# Patient Record
Sex: Male | Born: 1955 | Race: White | Hispanic: No | Marital: Married | Smoking: Never smoker
Health system: Southern US, Community
[De-identification: ages and names within clinical notes are randomized; demographics above are authoritative.]

## PROBLEM LIST (undated history)

## (undated) DIAGNOSIS — E785 Hyperlipidemia, unspecified: Secondary | ICD-10-CM

## (undated) DIAGNOSIS — F419 Anxiety disorder, unspecified: Secondary | ICD-10-CM

## (undated) DIAGNOSIS — K219 Gastro-esophageal reflux disease without esophagitis: Secondary | ICD-10-CM

## (undated) DIAGNOSIS — M542 Cervicalgia: Secondary | ICD-10-CM

## (undated) DIAGNOSIS — F32A Depression, unspecified: Secondary | ICD-10-CM

## (undated) DIAGNOSIS — I10 Essential (primary) hypertension: Secondary | ICD-10-CM

## (undated) DIAGNOSIS — F329 Major depressive disorder, single episode, unspecified: Secondary | ICD-10-CM

## (undated) DIAGNOSIS — G8929 Other chronic pain: Secondary | ICD-10-CM

## (undated) DIAGNOSIS — N400 Enlarged prostate without lower urinary tract symptoms: Secondary | ICD-10-CM

## (undated) HISTORY — DX: Depression, unspecified: F32.A

## (undated) HISTORY — DX: Anxiety disorder, unspecified: F41.9

## (undated) HISTORY — PX: TONSILLECTOMY: SUR1361

## (undated) HISTORY — PX: NECK SURGERY: SHX720

## (undated) HISTORY — PX: CERVICAL DISCECTOMY: SHX98

## (undated) HISTORY — PX: ROTATOR CUFF REPAIR: SHX139

## (undated) HISTORY — PX: CERVICAL FUSION: SHX112

## (undated) HISTORY — DX: Benign prostatic hyperplasia without lower urinary tract symptoms: N40.0

## (undated) HISTORY — DX: Hyperlipidemia, unspecified: E78.5

## (undated) HISTORY — DX: Major depressive disorder, single episode, unspecified: F32.9

---

## 2010-08-19 ENCOUNTER — Other Ambulatory Visit: Payer: Self-pay | Admitting: Neurosurgery

## 2010-08-19 DIAGNOSIS — M542 Cervicalgia: Secondary | ICD-10-CM

## 2010-08-19 DIAGNOSIS — M25519 Pain in unspecified shoulder: Secondary | ICD-10-CM

## 2010-08-24 ENCOUNTER — Ambulatory Visit
Admission: RE | Admit: 2010-08-24 | Discharge: 2010-08-24 | Disposition: A | Discharge status: Home / Self Care | Payer: BC Managed Care – PPO | Source: Ambulatory Visit | Attending: Neurosurgery | Admitting: Neurosurgery

## 2010-08-24 DIAGNOSIS — M542 Cervicalgia: Secondary | ICD-10-CM

## 2010-08-24 DIAGNOSIS — M25519 Pain in unspecified shoulder: Secondary | ICD-10-CM

## 2010-08-24 MED ORDER — IOHEXOL 300 MG/ML  SOLN: 75.0000 mL | Freq: Once | INTRAMUSCULAR | Status: AC | PRN

## 2010-08-27 ENCOUNTER — Other Ambulatory Visit: Payer: Self-pay | Admitting: Neurosurgery

## 2010-08-27 ENCOUNTER — Ambulatory Visit
Admission: RE | Admit: 2010-08-27 | Discharge: 2010-08-27 | Disposition: A | Discharge status: Home / Self Care | Payer: BC Managed Care – PPO | Source: Ambulatory Visit | Attending: Neurosurgery | Admitting: Neurosurgery

## 2010-08-27 DIAGNOSIS — G971 Other reaction to spinal and lumbar puncture: Secondary | ICD-10-CM

## 2010-10-11 ENCOUNTER — Other Ambulatory Visit (HOSPITAL_COMMUNITY): Payer: Self-pay | Admitting: Neurosurgery

## 2010-10-11 ENCOUNTER — Encounter (HOSPITAL_COMMUNITY)
Admission: RE | Admit: 2010-10-11 | Discharge: 2010-10-11 | Disposition: A | Discharge status: Home / Self Care | Payer: BC Managed Care – PPO | Source: Ambulatory Visit | Attending: Neurosurgery | Admitting: Neurosurgery

## 2010-10-11 ENCOUNTER — Ambulatory Visit (HOSPITAL_COMMUNITY)
Admission: RE | Admit: 2010-10-11 | Discharge: 2010-10-11 | Disposition: A | Discharge status: Home / Self Care | Payer: BC Managed Care – PPO | Source: Ambulatory Visit | Attending: Neurosurgery | Admitting: Neurosurgery

## 2010-10-11 DIAGNOSIS — Z01818 Encounter for other preprocedural examination: Secondary | ICD-10-CM | POA: Insufficient documentation

## 2010-10-11 DIAGNOSIS — M5020 Other cervical disc displacement, unspecified cervical region: Secondary | ICD-10-CM

## 2010-10-11 DIAGNOSIS — Z01812 Encounter for preprocedural laboratory examination: Secondary | ICD-10-CM | POA: Insufficient documentation

## 2010-10-11 LAB — COMPREHENSIVE METABOLIC PANEL
BUN: 17 mg/dL (ref 6–23)
CO2: 30 mEq/L (ref 19–32)
Chloride: 104 mEq/L (ref 96–112)
Creatinine, Ser: 0.83 mg/dL (ref 0.4–1.5)
GFR calc non Af Amer: >60 mL/min (ref >60)
Glucose, Bld: 88 mg/dL (ref 70–99)
Total Bilirubin: 0.6 mg/dL (ref 0.3–1.2)

## 2010-10-11 LAB — TYPE AND SCREEN
ABO/RH(D): A POS
Antibody Screen: NEGATIVE

## 2010-10-11 LAB — CBC
HCT: 40.9 % (ref 39.0–52.0)
Hemoglobin: 14 g/dL (ref 13.0–17.0)
MCH: 30.8 pg (ref 26.0–34.0)
MCV: 90.1 fL (ref 78.0–100.0)
Platelets: 221 10*3/uL (ref 150–400)
RBC: 4.54 MIL/uL (ref 4.22–5.81)

## 2010-10-11 LAB — DIFFERENTIAL
Eosinophils Absolute: 0.2 10*3/uL (ref 0.0–0.7)
Lymphs Abs: 1.5 10*3/uL (ref 0.7–4.0)
Monocytes Relative: 11 % (ref 3–12)
Neutrophils Relative %: 57 % (ref 43–77)

## 2010-10-18 ENCOUNTER — Inpatient Hospital Stay (HOSPITAL_COMMUNITY): Payer: BC Managed Care – PPO

## 2010-10-18 ENCOUNTER — Observation Stay (HOSPITAL_COMMUNITY)
Admission: RE | Admit: 2010-10-18 | Discharge: 2010-10-19 | DRG: 865 | Disposition: A | Discharge status: Home / Self Care | Payer: BC Managed Care – PPO | Source: Ambulatory Visit | Attending: Neurosurgery | Admitting: Neurosurgery

## 2010-10-18 DIAGNOSIS — Z01810 Encounter for preprocedural cardiovascular examination: Secondary | ICD-10-CM | POA: Insufficient documentation

## 2010-10-18 DIAGNOSIS — M47812 Spondylosis without myelopathy or radiculopathy, cervical region: Principal | ICD-10-CM | POA: Insufficient documentation

## 2010-10-18 DIAGNOSIS — K449 Diaphragmatic hernia without obstruction or gangrene: Secondary | ICD-10-CM | POA: Insufficient documentation

## 2010-10-18 DIAGNOSIS — G4733 Obstructive sleep apnea (adult) (pediatric): Secondary | ICD-10-CM | POA: Insufficient documentation

## 2010-10-18 DIAGNOSIS — I10 Essential (primary) hypertension: Secondary | ICD-10-CM | POA: Insufficient documentation

## 2010-10-28 ENCOUNTER — Ambulatory Visit
Admission: RE | Admit: 2010-10-28 | Discharge: 2010-10-28 | Disposition: A | Discharge status: Home / Self Care | Payer: BC Managed Care – PPO | Source: Ambulatory Visit | Attending: Neurosurgery | Admitting: Neurosurgery

## 2010-10-28 ENCOUNTER — Other Ambulatory Visit: Payer: Self-pay | Admitting: Neurosurgery

## 2010-10-28 DIAGNOSIS — M542 Cervicalgia: Secondary | ICD-10-CM

## 2010-10-28 NOTE — Op Note (Signed)
Mark Maxwell, Mark Maxwell              ACCOUNT NO.:  [id number]  MEDICAL RECORD NO.:  [id number]           PATIENT TYPE:  I  LOCATION:  3524                         FACILITY:  [facility]  PHYSICIAN:  Asahd Can A. Lian Tanori, M.D.    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  PROCEDURE NAME:  C4-5, C5-6, C6-7 anterior cervical diskectomy and fusion with allograft and plating.  SURGEON:  Kathaleen Maser. Shalah Estelle, MD  ASSISTANT:  Donalee Citrin, MD  ANESTHESIA:  General endotracheal.  INDICATIONS:  Mark Maxwell is a 55 year old male with history of chronic neck pain with radiation to his right upper extremity.  The patient has undergone MRI scanning and CT myelogram, which demonstrates evidence of marked spondylosis and stenosis at C4-5, C5-6 and C6-7.  The patient counseled as to his options.  He decided to proceed with three-level anterior cervical diskectomy and fusion with allograft and plating in hopes of improving his symptoms.  OPERATION:  The patient was taken to the operating room and placed on the table in supine position.  After adequate level of anesthesia was achieved, the patient was positioned supine with neck slightly extended and held in place with halter traction.  The patient's anterior cervical region was prepped and draped sterilely.  A #10 blade was used to make a linear skin incision overlying the C5-6 level.  This was carried down sharply to the platysma.  The platysma was then divided vertically and dissection proceeded on the medial border of the sternocleidomastoid muscle and carotid sheath.  Trachea and esophagus were mobilized and retracted towards the left.  The prevertebral fascia was stripped off the anterior spinal column.   Longus colli muscle was elevated bilaterally using electrocautery.  Deep self-retaining retractor was placed.  Intraoperative fluoroscopy was used and the levels were confirmed.  Disk spaces at C4-5, C5-6 and C6-7 were then incised with a 15-blade in a rectangular fashion.  Wide disk space clean-out was achieved using pituitary rongeurs, forward and backward Karlin curettes, Kerrison rongeurs, and high-speed drill.  Elements of the disk removed down to the level of the posterior annulus.  Microscope was then brought into the field and used throughout the remainder of diskectomy.  The remaining aspects of annulus and osteophytes were removed using high- speed drill down to the level of the posterior longitudinal ligament. The posterior longitudinal ligament was elevated and resected in piecemeal fashion using Kerrison rongeurs.  The underlying thecal sac was then identified.  Wide central decompression then was performed by undercutting the bodies of C6 and C7.  Decompression was then proceeded out to the edges of the neural foramen.  Wide anterior foraminotomies were then performed along the course of the exiting C7 nerve roots bilaterally.  At this point, a very thorough decompression had been achieved.  There was no evidence of injury to the thecal sac or nerve roots.  Procedure then repeated again at C5-6 and C4-5.  Wound was then irrigated with antibiotic solution.  Gelfoam was placed topically for hemostasis, and found to be good.  Cornerstone allograft wedges were then packed into place at C4-5, C5-6, and C6-7.  There were recessed approximately 1 mm from the anterior cortical margin at all three levels.  A 63-mm Atlantis translational anterior plate was then placed over the C4, C5, C6, and C7 levels.  This was then attached under fluoroscopic guidance using 13-mm fixed angle screws, two each at all four levels.  All screws given final tightening and found to be solidly within the  bone.  Locking screws were engaged at all levels.  Final images revealed good position of the bone grafts, hardware at proper operative level with normal alignment of spine.  The wound was then irrigated with antibiotic solution.  Hemostasis was then ensured with bipolar electrocautery.  A medium Hemovac drain was left in the prevertebral space.  Wound was then closed in typical fashion.  Steri- Strips and sterile dressings were applied.  There were no complications. The patient tolerated procedure well and he returns to recovery room postoperatively.          ______________________________ Kathaleen Maser Mark Maxwell, M.D.     HAP/MEDQ  D:  10/18/2010  T:  10/18/2010  Job:  [id number]  Electronically Signed by Julio Sicks M.D. on 10/28/2010 11:19:17 AM

## 2010-12-09 ENCOUNTER — Ambulatory Visit
Admission: RE | Admit: 2010-12-09 | Discharge: 2010-12-09 | Disposition: A | Discharge status: Home / Self Care | Payer: BC Managed Care – PPO | Source: Ambulatory Visit | Attending: Neurosurgery | Admitting: Neurosurgery

## 2010-12-09 ENCOUNTER — Other Ambulatory Visit: Payer: Self-pay | Admitting: Neurosurgery

## 2010-12-09 DIAGNOSIS — M542 Cervicalgia: Secondary | ICD-10-CM

## 2011-05-02 IMAGING — CR DG CHEST 2V
2 series · 2 of 2 positions shown · non-contrast
Comparison: None.

CLINICAL DATA: Pre admit for herniated cervical disc

CHEST - 2 VIEW

[view not recorded (1 of 2)]
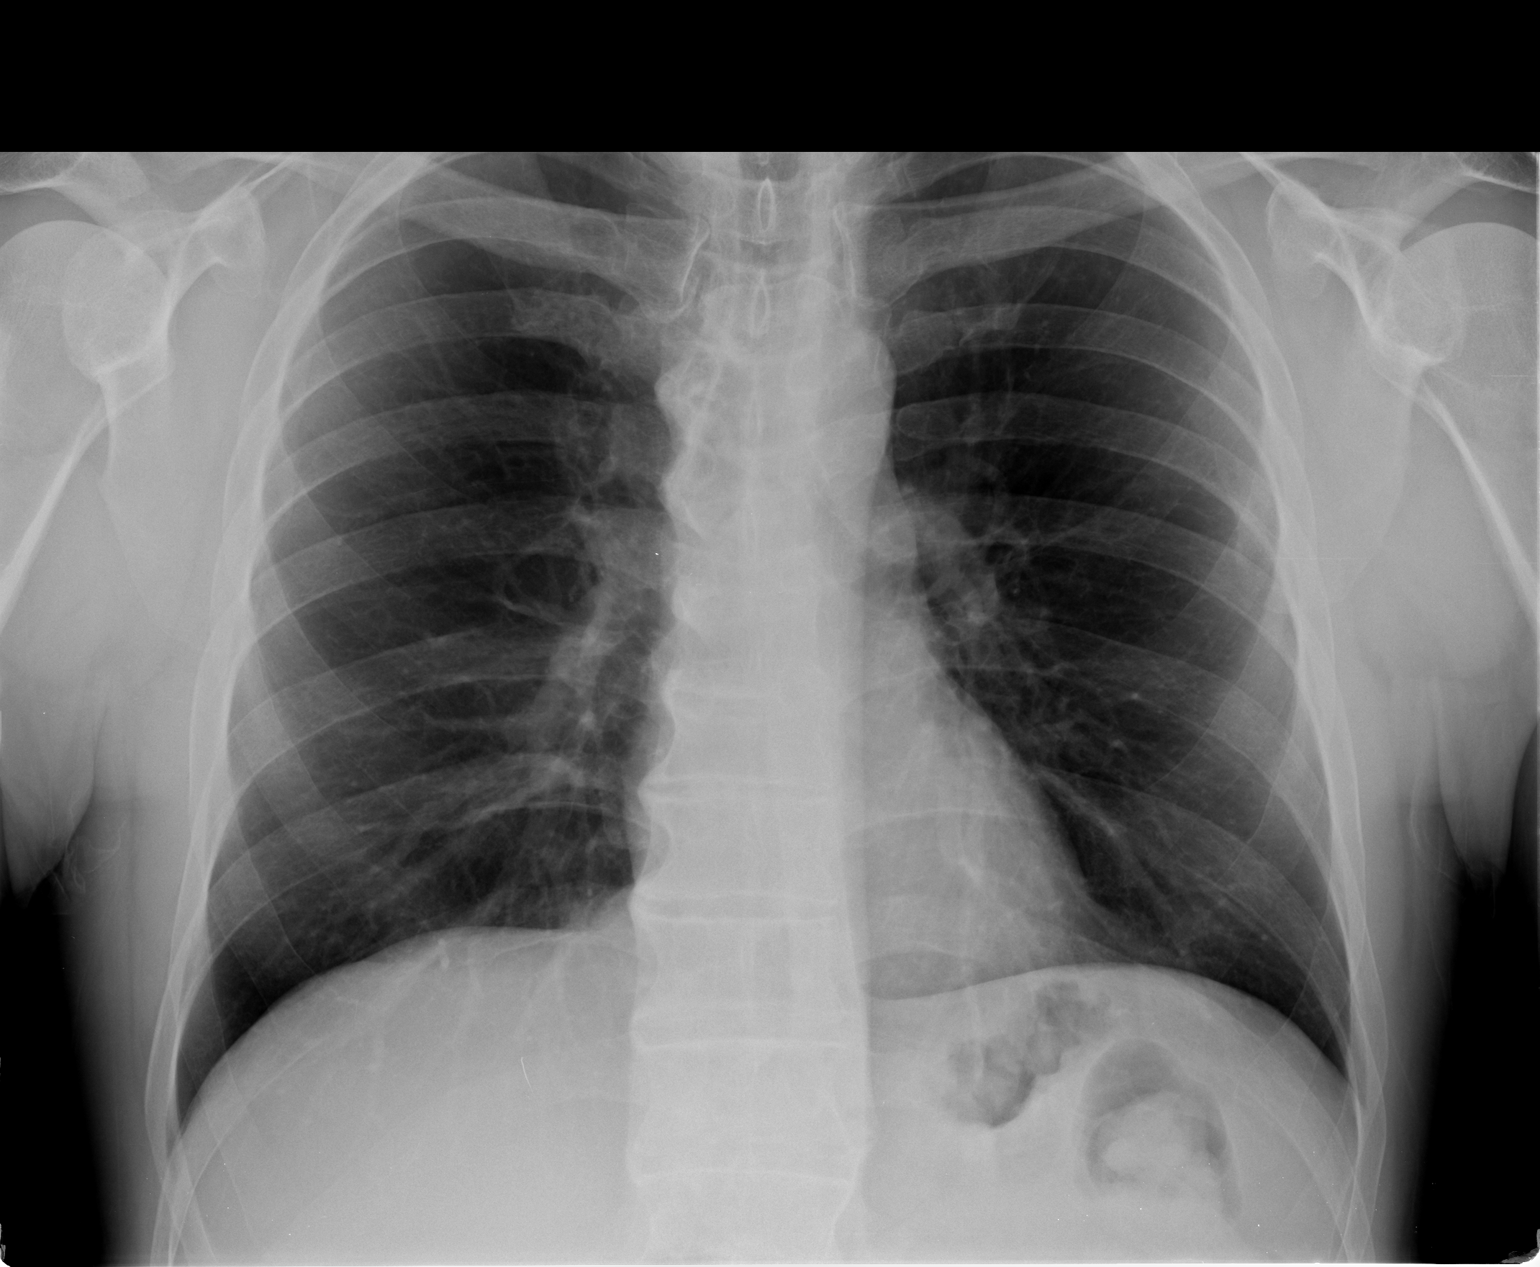

[view not recorded (2 of 2)]
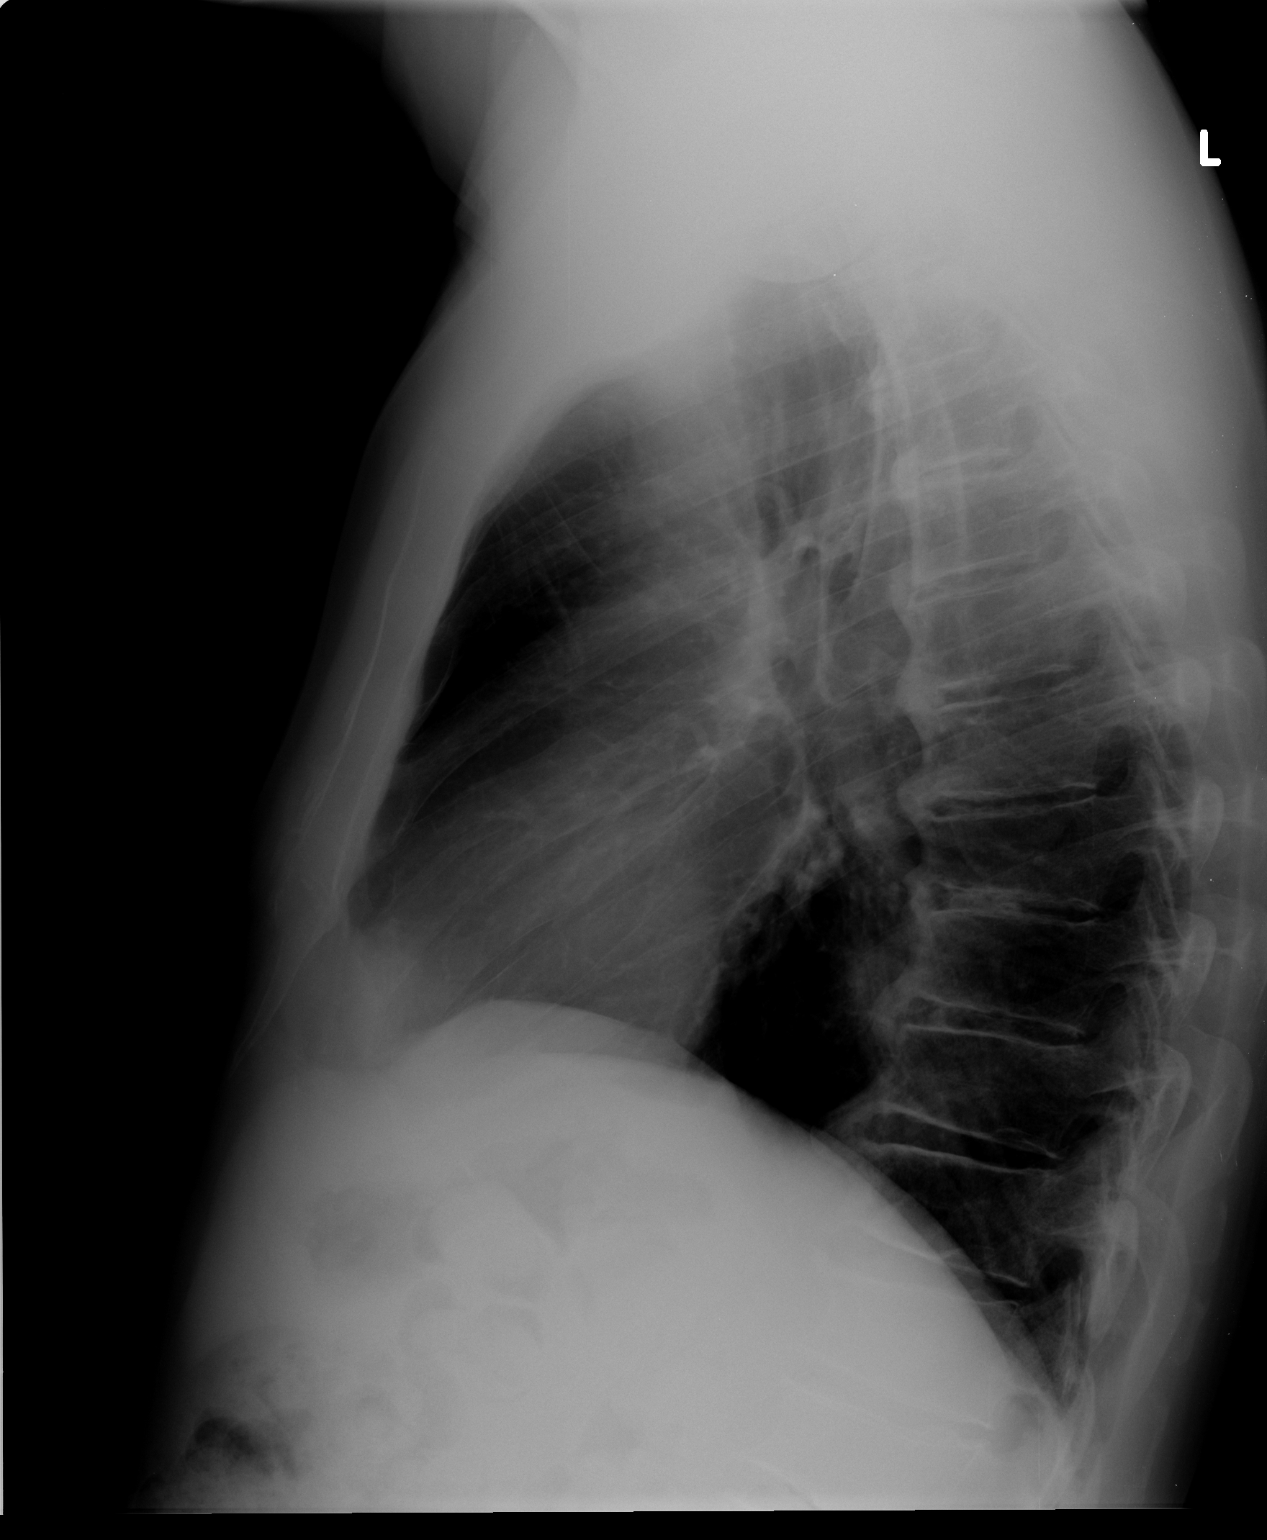

[2 of 2 positions shown; findings below may reference images not displayed]

FINDINGS: Heart and mediastinal contours normal.  Lungs clear.
Moderately advanced degenerative changes of the dorsal spine noted.
IMPRESSION: No active cardiopulmonary disease.

## 2011-05-12 ENCOUNTER — Other Ambulatory Visit: Payer: Self-pay | Admitting: Neurosurgery

## 2011-05-12 ENCOUNTER — Ambulatory Visit
Admission: RE | Admit: 2011-05-12 | Discharge: 2011-05-12 | Disposition: A | Discharge status: Home / Self Care | Payer: BC Managed Care – PPO | Source: Ambulatory Visit | Attending: Neurosurgery | Admitting: Neurosurgery

## 2011-05-12 DIAGNOSIS — M542 Cervicalgia: Secondary | ICD-10-CM

## 2011-06-30 IMAGING — CR DG CERVICAL SPINE 2 OR 3 VIEWS
3 series · 3 of 3 positions shown · non-contrast
Comparison: 10/28/2010

CLINICAL DATA: Right neck pain/post cervical fusion

CERVICAL SPINE - 2-3 VIEW

[w c-spine lat]
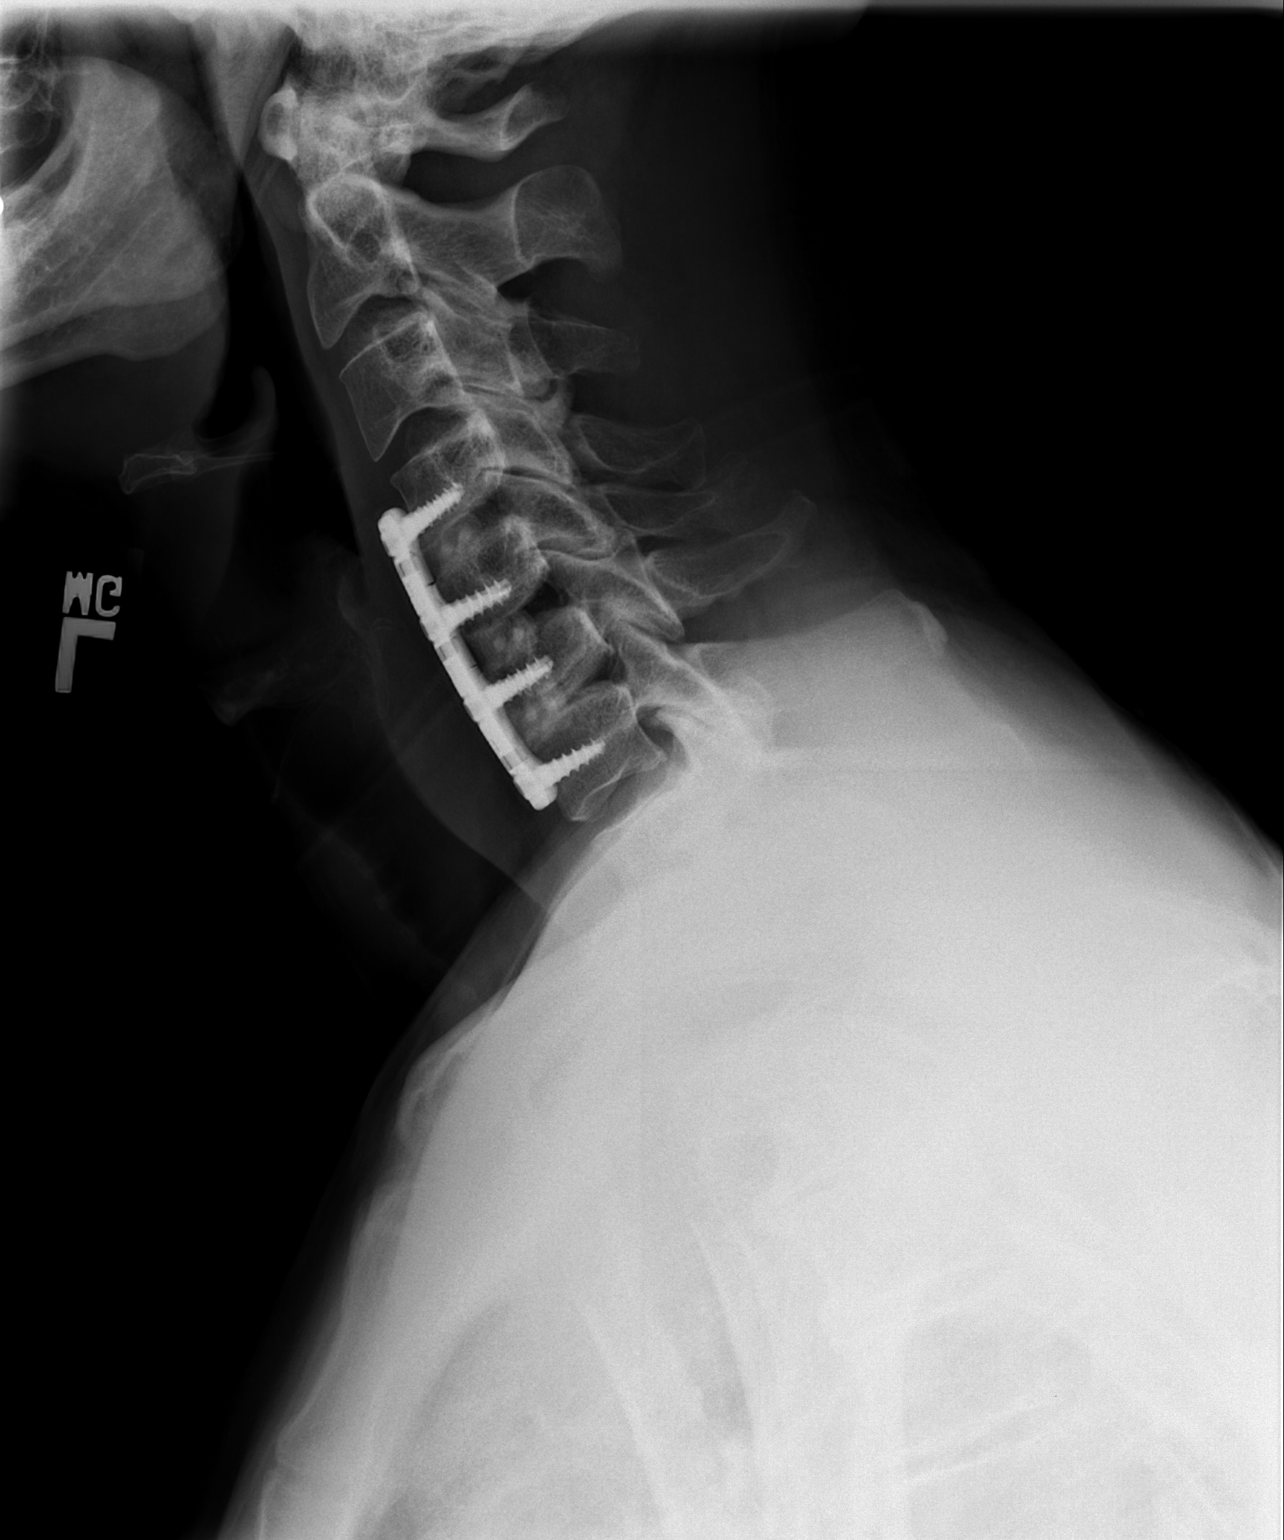

[w c-spine flexion]
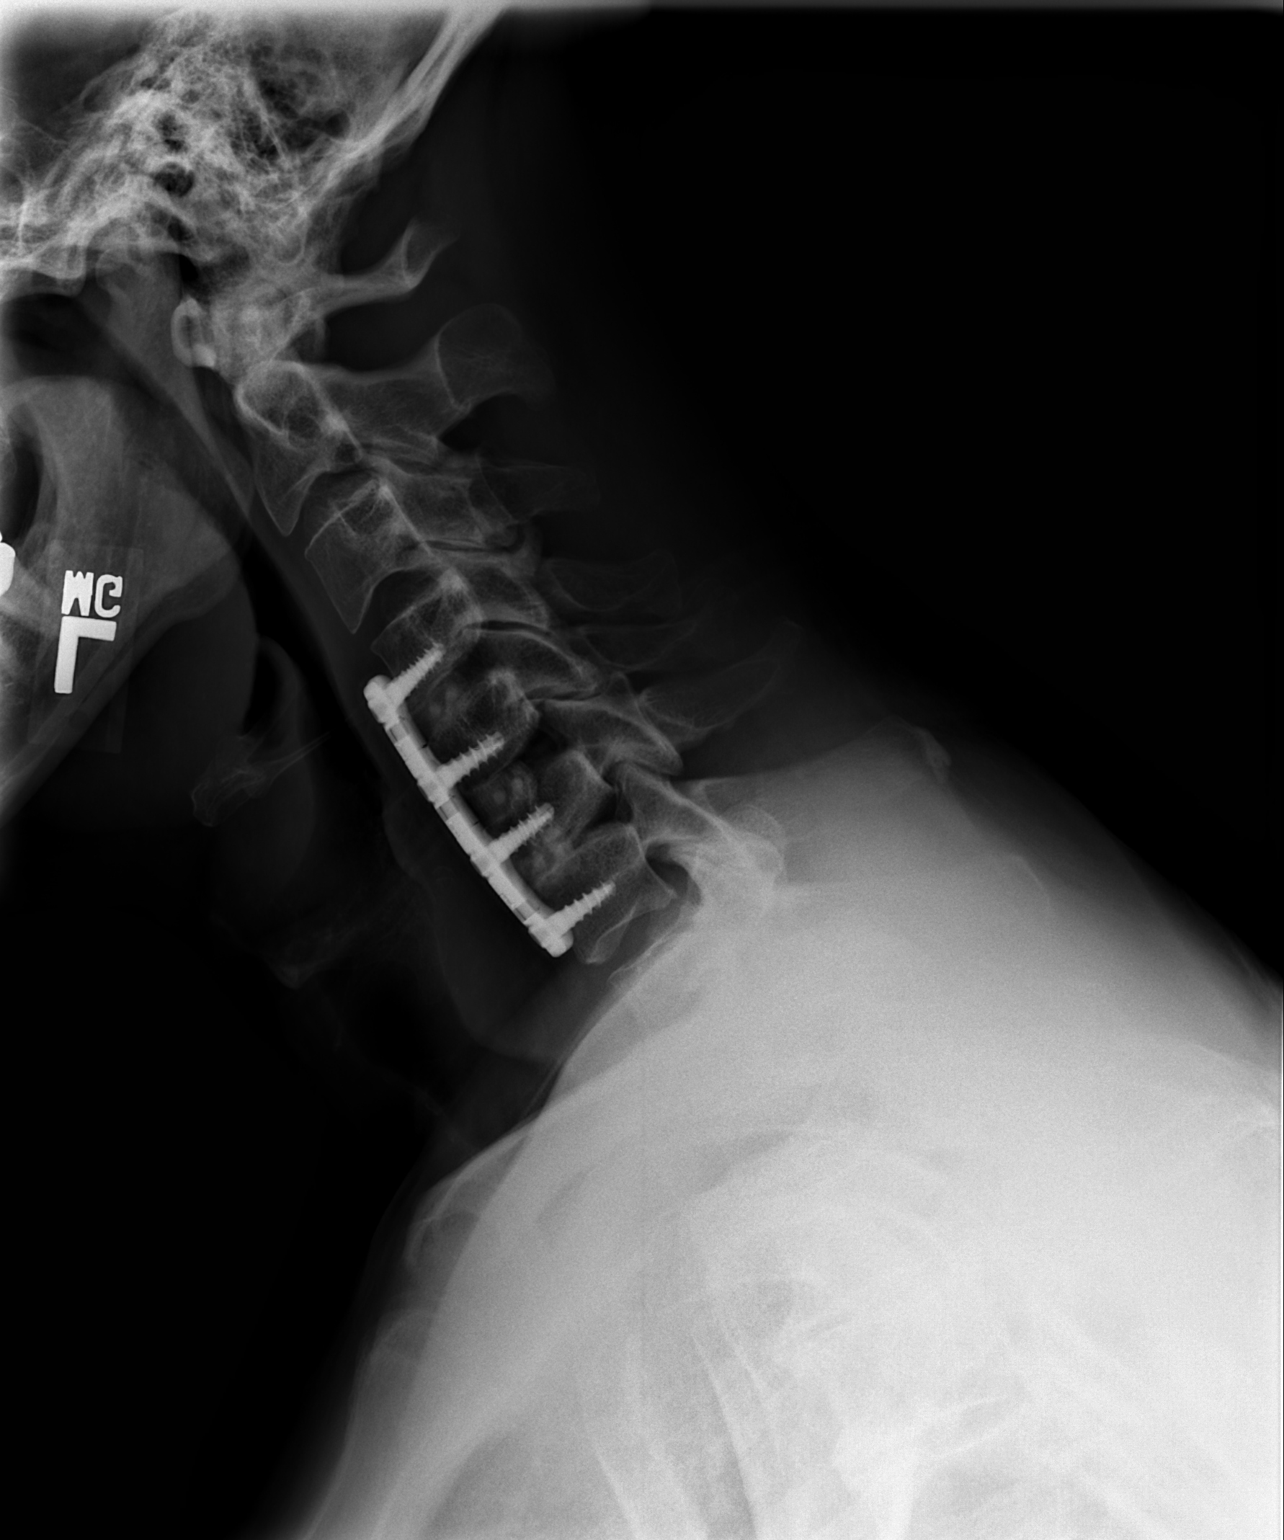

[w c-spine extension *]
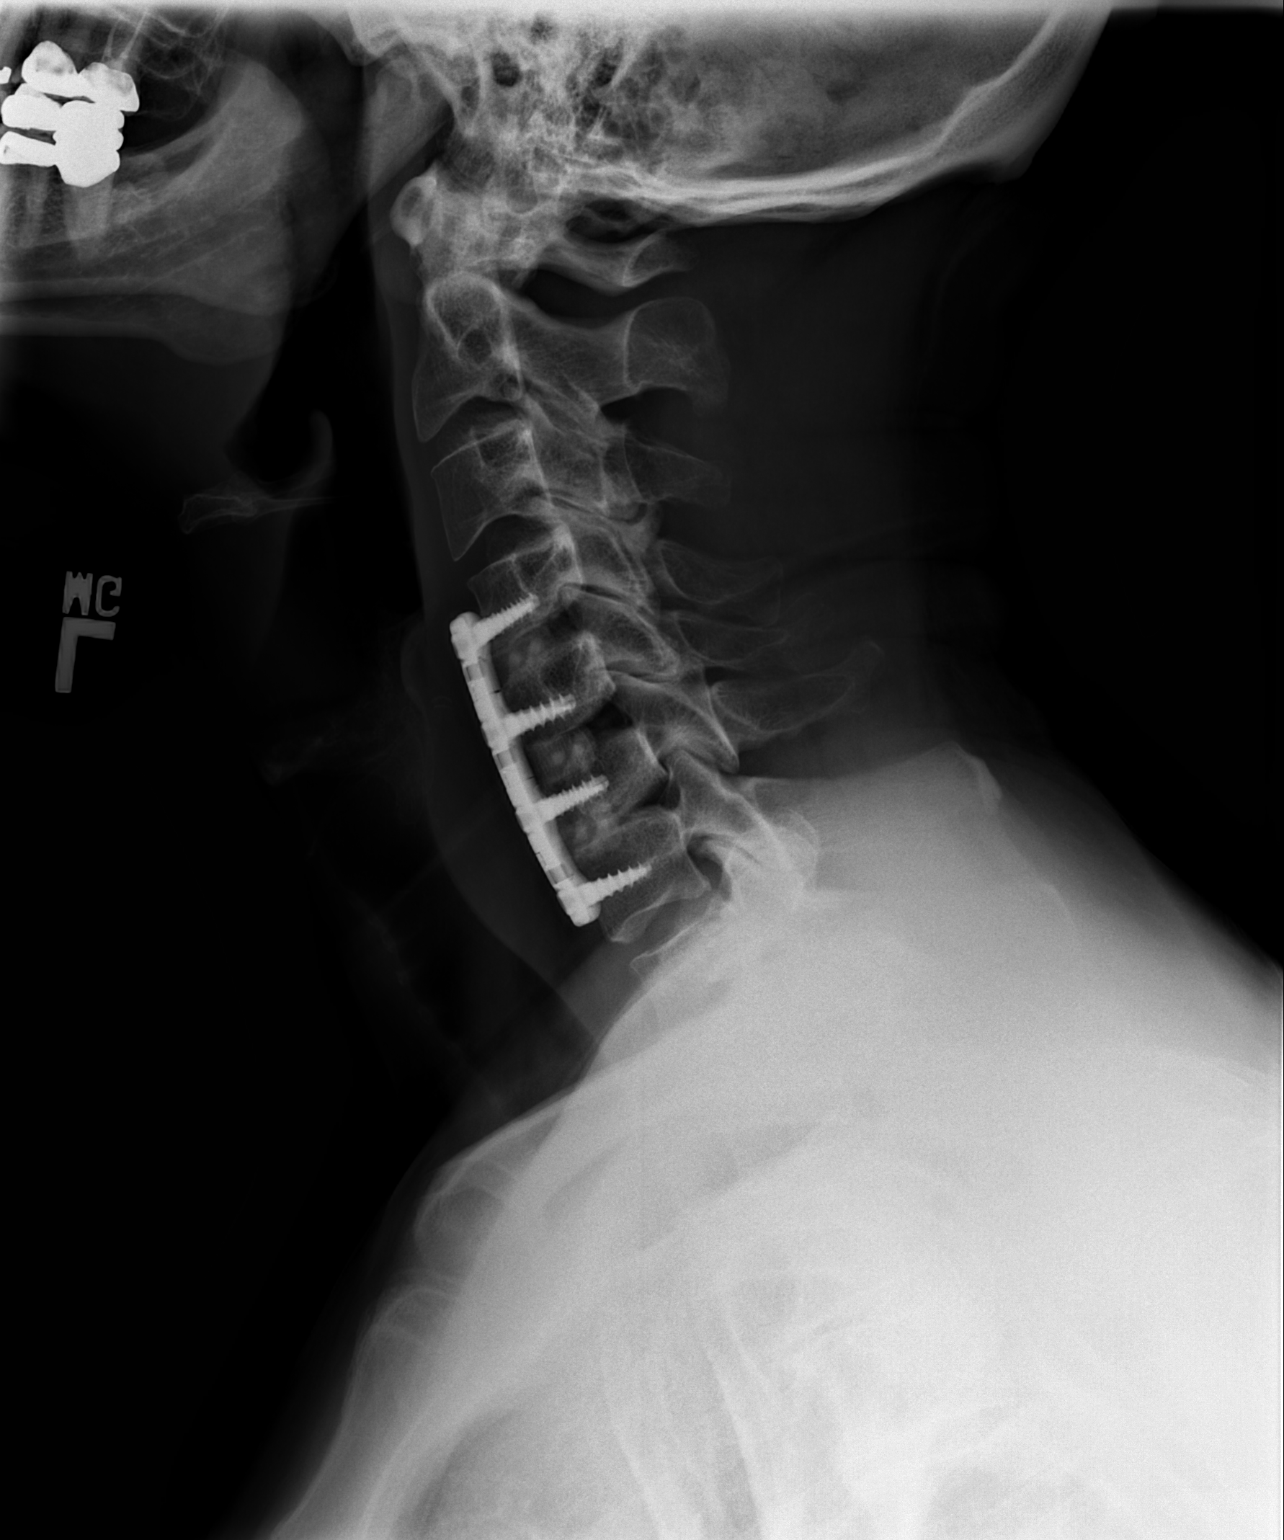

[3 of 3 positions shown; findings below may reference images not displayed]

FINDINGS: Post C4-C7 ACDF with anterior plate.  Good position and
alignment of bony elements and hardware.  No obvious complications
or interval change.

Range of motion is limited.  No obvious instability through flexion
or extension.
IMPRESSION: Good position and alignment following C4-C7 ACDF with anterior
plate.

## 2012-01-16 ENCOUNTER — Other Ambulatory Visit: Payer: Self-pay | Admitting: Neurosurgery

## 2012-01-16 DIAGNOSIS — M549 Dorsalgia, unspecified: Secondary | ICD-10-CM

## 2012-01-16 DIAGNOSIS — M542 Cervicalgia: Secondary | ICD-10-CM

## 2012-01-27 ENCOUNTER — Ambulatory Visit
Admission: RE | Admit: 2012-01-27 | Discharge: 2012-01-27 | Disposition: A | Discharge status: Home / Self Care | Payer: BC Managed Care – PPO | Source: Ambulatory Visit | Attending: Neurosurgery | Admitting: Neurosurgery

## 2012-01-27 VITALS — BP 130/67 | HR 58

## 2012-01-27 DIAGNOSIS — M542 Cervicalgia: Secondary | ICD-10-CM

## 2012-01-27 DIAGNOSIS — M549 Dorsalgia, unspecified: Secondary | ICD-10-CM

## 2012-01-27 MED ORDER — MEPERIDINE HCL 100 MG/ML IJ SOLN
75.0000 mg | Freq: Once | INTRAMUSCULAR | Status: AC
  Administered 2012-01-27: 75 mg via INTRAMUSCULAR

## 2012-01-27 MED ORDER — IOHEXOL 300 MG/ML  SOLN
10.0000 mL | Freq: Once | INTRAMUSCULAR | Status: AC | PRN
  Administered 2012-01-27: 10 mL via INTRATHECAL

## 2012-01-27 MED ORDER — ONDANSETRON HCL 4 MG/2ML IJ SOLN
4.0000 mg | Freq: Once | INTRAMUSCULAR | Status: AC
  Administered 2012-01-27: 4 mg via INTRAMUSCULAR

## 2012-01-27 MED ORDER — DIAZEPAM 5 MG PO TABS
10.0000 mg | ORAL_TABLET | Freq: Once | ORAL | Status: AC
  Administered 2012-01-27: 10 mg via ORAL

## 2012-01-29 ENCOUNTER — Emergency Department (HOSPITAL_BASED_OUTPATIENT_CLINIC_OR_DEPARTMENT_OTHER): Payer: No Typology Code available for payment source

## 2012-01-29 ENCOUNTER — Emergency Department (HOSPITAL_BASED_OUTPATIENT_CLINIC_OR_DEPARTMENT_OTHER)
Admission: EM | Admit: 2012-01-29 | Discharge: 2012-01-29 | Disposition: A | Discharge status: Home / Self Care | Payer: No Typology Code available for payment source | Attending: Emergency Medicine | Admitting: Emergency Medicine

## 2012-01-29 ENCOUNTER — Encounter (HOSPITAL_BASED_OUTPATIENT_CLINIC_OR_DEPARTMENT_OTHER): Payer: Self-pay | Admitting: Emergency Medicine

## 2012-01-29 DIAGNOSIS — Z981 Arthrodesis status: Secondary | ICD-10-CM | POA: Insufficient documentation

## 2012-01-29 DIAGNOSIS — S161XXA Strain of muscle, fascia and tendon at neck level, initial encounter: Secondary | ICD-10-CM

## 2012-01-29 DIAGNOSIS — M549 Dorsalgia, unspecified: Secondary | ICD-10-CM | POA: Insufficient documentation

## 2012-01-29 DIAGNOSIS — I10 Essential (primary) hypertension: Secondary | ICD-10-CM | POA: Insufficient documentation

## 2012-01-29 DIAGNOSIS — M542 Cervicalgia: Secondary | ICD-10-CM | POA: Insufficient documentation

## 2012-01-29 DIAGNOSIS — S139XXA Sprain of joints and ligaments of unspecified parts of neck, initial encounter: Secondary | ICD-10-CM | POA: Insufficient documentation

## 2012-01-29 HISTORY — DX: Other chronic pain: G89.29

## 2012-01-29 HISTORY — DX: Cervicalgia: M54.2

## 2012-01-29 HISTORY — DX: Gastro-esophageal reflux disease without esophagitis: K21.9

## 2012-01-29 HISTORY — DX: Essential (primary) hypertension: I10

## 2012-01-29 MED ORDER — HYDROMORPHONE HCL PF 2 MG/ML IJ SOLN
2.0000 mg | Freq: Once | INTRAMUSCULAR | Status: AC
  Administered 2012-01-29: 2 mg via INTRAMUSCULAR
  Filled 2012-01-29: qty 1
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
## 2012-01-29 NOTE — ED Notes (Signed)
Returned from xray

## 2012-05-02 ENCOUNTER — Encounter (HOSPITAL_COMMUNITY): Payer: Self-pay | Admitting: Psychiatry

## 2012-05-02 ENCOUNTER — Ambulatory Visit (INDEPENDENT_AMBULATORY_CARE_PROVIDER_SITE_OTHER): Payer: BC Managed Care – PPO | Admitting: Psychiatry

## 2012-05-02 VITALS — BP 118/68 | HR 66 | Ht 73.0 in | Wt 188.8 lb

## 2012-05-02 DIAGNOSIS — F329 Major depressive disorder, single episode, unspecified: Secondary | ICD-10-CM | POA: Insufficient documentation

## 2012-05-02 DIAGNOSIS — F0630 Mood disorder due to known physiological condition, unspecified: Secondary | ICD-10-CM

## 2012-05-02 MED ORDER — AMITRIPTYLINE HCL 25 MG PO TABS: 25.0000 mg | ORAL_TABLET | Freq: Every day | ORAL | Status: DC
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
## 2012-05-24 ENCOUNTER — Encounter (HOSPITAL_COMMUNITY): Payer: Self-pay | Admitting: Psychiatry

## 2012-05-24 ENCOUNTER — Ambulatory Visit (INDEPENDENT_AMBULATORY_CARE_PROVIDER_SITE_OTHER): Payer: BC Managed Care – PPO | Admitting: Psychiatry

## 2012-05-24 VITALS — BP 122/65 | HR 75 | Wt 190.8 lb

## 2012-05-24 DIAGNOSIS — F0630 Mood disorder due to known physiological condition, unspecified: Secondary | ICD-10-CM

## 2012-05-24 MED ORDER — AMITRIPTYLINE HCL 25 MG PO TABS: ORAL_TABLET | ORAL | Status: DC
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
## 2012-05-25 ENCOUNTER — Encounter (HOSPITAL_COMMUNITY): Payer: Self-pay | Admitting: Licensed Clinical Social Worker

## 2012-05-25 ENCOUNTER — Ambulatory Visit (INDEPENDENT_AMBULATORY_CARE_PROVIDER_SITE_OTHER): Payer: BC Managed Care – PPO | Admitting: Licensed Clinical Social Worker

## 2012-05-25 DIAGNOSIS — F329 Major depressive disorder, single episode, unspecified: Secondary | ICD-10-CM
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
## 2012-06-07 ENCOUNTER — Encounter (HOSPITAL_COMMUNITY): Payer: Self-pay | Admitting: Licensed Clinical Social Worker

## 2012-06-07 ENCOUNTER — Ambulatory Visit (INDEPENDENT_AMBULATORY_CARE_PROVIDER_SITE_OTHER): Payer: BC Managed Care – PPO | Admitting: Licensed Clinical Social Worker

## 2012-06-07 DIAGNOSIS — F329 Major depressive disorder, single episode, unspecified: Secondary | ICD-10-CM

## 2012-06-07 NOTE — Progress Notes (Signed)
   THERAPIST PROGRESS NOTE  Session Time: 9:30am-10:20am  Participation Level: Active  Behavioral Response: Well GroomedAlertDepressed  Type of Therapy: Individual Therapy  Treatment Goals addressed: Coping  Interventions: CBT, Motivational Interviewing, Solution Focused, Strength-based, Supportive and Reframing  Summary: Mark Maxwell is a 56 y.o. male who presents with depressed mood and flat affect. He reports ongoing depression, frustration and anxiety related to his job and passive suicidal ideation. The thoughts are disturbing to him but he clearly states that he will not act upon them because of his faith and his family. His primary stressor is his job. He feels taken advantage of and that the expectations under which he must work are unreasonable. He tries to do everything which is asked of him, but is unable to do so. He did talk to his boss and request a change of his job so he would no longer be in charge, but he was told no. He finds it hard to set limits and say no and realizes that he identifies himself as the man who takes care of everyone and everything. His pain level is very high today and he plans to see a doctor in [facility] soon for another opinion.    Suicidal/Homicidal: Yeswithout intent/plan  Therapist Response: Assessed patients current functioning and reviewed progress. Reviewed coping strategies. Assessed patients safety and assisted in identifying protective factors.  Reviewed crisis plan with patient. Assisted patient with the expression of his feelings of depression and frustration. Discussed healthy boundaries and assertive communication. Assessed areas at work where he can begin to delegate. Patient does have a gun in his home and is willing to lock it up and give his wife the key so he does not have access to it. He is able to clearly communicate his protective factors and that his suicidality is passive. Used CBT to assist patient with the identification of  negative distortions and irrational thoughts. Encouraged patient to verbalize alternative and factual responses which challenge thought distortions. Used motivational interviewing to assist and encourage patient through the change process. Explored patients barriers to change. Reviewed patients self care plan. Assessed  progress related to self care. Patient's self care is good, but could improve. Recommend proper diet, regular exercise, socialization and recreation.   Plan: Return again in one weeks.  Diagnosis: Axis I: Major Depression, Recurrent severe    Axis II: No diagnosis    Tinesha Siegrist, LCSW 06/07/2012

## 2012-06-21 ENCOUNTER — Ambulatory Visit (INDEPENDENT_AMBULATORY_CARE_PROVIDER_SITE_OTHER): Payer: BC Managed Care – PPO | Admitting: Licensed Clinical Social Worker

## 2012-06-21 DIAGNOSIS — F329 Major depressive disorder, single episode, unspecified: Secondary | ICD-10-CM

## 2012-06-21 NOTE — Progress Notes (Signed)
   THERAPIST PROGRESS NOTE  Session Time: 4:00pm-4:50pm  Participation Level: Active  Behavioral Response: Well GroomedAlertDepressed  Type of Therapy: Individual Therapy  Treatment Goals addressed: Coping  Interventions: CBT, Motivational Interviewing, Solution Focused, Strength-based, Supportive and Reframing  Summary: Mark Maxwell is a 57 y.o. male who presents with depressed mood and flat affect. He reports ongoing depression with passive suicidal thoughts and does not feel that his medication is working as well as it could. Recommend patient discuss a medication change or increase with Dr. Lolly Mustache. He is pleased that he talked to his boss and told her that he needs to step down and can no longer be in charge. He feels this is a huge weight off his shoulders. He is fearful that he is going to have to work under his current partner whom he has difficulty with. He has an expectation of himself to be "tough" and feels that he is weak by asking for help. His pain is not well controlled and he is fearful to take more pain medication because he does not want to become addicted to it. He is able to recognize that his core beliefs are interfering with his self care, but is challenged by changing this. His sleep is poor and his appetite is wnl.    Suicidal/Homicidal: Nowithout intent/plan  Therapist Response: Assessed patients current functioning and reviewed progress. Reviewed coping strategies. Assessed patients safety and assisted in identifying protective factors.  Reviewed crisis plan with patient. Assisted patient with the expression of his feelings of depression and frustration. Patient has normalized his overcommittment at work.  Used CBT to assist patient with the identification of negative distortions and irrational thoughts. Encouraged patient to verbalize alternative and factual responses which challenge thought distortions. [name] resistant when his core beliefs about being strong and  tough no matter what are challenged. Reviewed patients self care plan. Assessed  progress related to self care. Patient's self care is fair. Recommend proper diet, regular exercise, socialization and recreation. Used motivational interviewing to assist and encourage patient through the change process. Explored patients barriers to change. His able to commit to safety, agrees to seek help if he becomes fearful that he will harm himself and is able to discuss his protective factors such as his wife and children.   Plan: Return again in one weeks.  Diagnosis: Axis I: Major Depression, Recurrent severe    Axis II: No diagnosis    Mark Ludlam, LCSW 06/21/2012

## 2012-06-26 ENCOUNTER — Ambulatory Visit (HOSPITAL_COMMUNITY): Payer: Self-pay | Admitting: Licensed Clinical Social Worker

## 2012-07-05 ENCOUNTER — Ambulatory Visit (INDEPENDENT_AMBULATORY_CARE_PROVIDER_SITE_OTHER): Payer: BC Managed Care – PPO | Admitting: Psychiatry

## 2012-07-05 ENCOUNTER — Encounter (HOSPITAL_COMMUNITY): Payer: Self-pay | Admitting: Psychiatry

## 2012-07-05 VITALS — BP 124/85 | HR 90 | Wt 198.6 lb

## 2012-07-05 DIAGNOSIS — F0630 Mood disorder due to known physiological condition, unspecified: Secondary | ICD-10-CM

## 2012-07-05 MED ORDER — AMITRIPTYLINE HCL 75 MG PO TABS: 75.0000 mg | ORAL_TABLET | Freq: Every day | ORAL | Status: DC

## 2012-07-05 MED ORDER — AMITRIPTYLINE HCL 25 MG PO TABS: ORAL_TABLET | ORAL | Status: DC
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
## 2012-07-06 ENCOUNTER — Ambulatory Visit (INDEPENDENT_AMBULATORY_CARE_PROVIDER_SITE_OTHER): Payer: BC Managed Care – PPO | Admitting: Licensed Clinical Social Worker

## 2012-07-06 DIAGNOSIS — F329 Major depressive disorder, single episode, unspecified: Secondary | ICD-10-CM

## 2012-07-06 NOTE — Progress Notes (Signed)
   THERAPIST PROGRESS NOTE  Session Time: 9:30am-10:20am  Participation Level: Active  Behavioral Response: Well GroomedAlertDepressed  Type of Therapy: Individual Therapy  Treatment Goals addressed: Coping  Interventions: CBT, Motivational Interviewing, Strength-based, Supportive and Reframing  Summary: Mark Maxwell is a 57 y.o. male who presents with depressed mood and flat affect. His chronic pain is very high today and he reports continued feelings of depression with passive suicidal ideation. His pain and his job are the primary stressors in his life. He processes his frustration and anger with his job and with his partner. He is anxious about giving his partner his evaluation because he does not like conflict and he has to confront him. Patient has difficulty setting appropriate and healthy boundaries. 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 His sleep is disrupted by pain and his appetite is wnl.    Suicidal/Homicidal: Nowithout intent/plan  Therapist Response: Assessed patients current functioning and reviewed progress. Reviewed coping strategies. Assessed patients safety and assisted in identifying protective factors.  Reviewed crisis plan with patient. Assisted patient with the expression of his feelings of depression and frustration. Patients thoughts and internal dialogue are negative and irrational at times. Reviewed expression of healthy boundaries an enabling behaviors. Used CBT to assist patient with the identification of negative distortions and irrational thoughts. Encouraged patient to verbalize alternative and factual responses which challenge thought distortions. Used motivational interviewing to assist and encourage patient through the change process. Explored patients barriers to change. Reviewed patients self care plan. Assessed  progress related to  self care. Patient's self care is fair. Recommend proper diet, regular exercise, socialization and recreation. [name] able to clearly commit to safety and expresses his protective factors as his family and his religion.   Plan: Return again in one weeks.  Diagnosis: Axis I: Depressive Disorder NOS    Axis II: No diagnosis    Gyan Cambre, LCSW 07/06/2012

## 2012-07-11 ENCOUNTER — Ambulatory Visit (HOSPITAL_COMMUNITY): Payer: Self-pay | Admitting: Licensed Clinical Social Worker

## 2012-07-16 ENCOUNTER — Other Ambulatory Visit (HOSPITAL_COMMUNITY): Payer: Self-pay | Admitting: *Deleted

## 2012-07-16 NOTE — Telephone Encounter (Signed)
See phone note

## 2012-07-17 ENCOUNTER — Ambulatory Visit (HOSPITAL_COMMUNITY): Payer: Self-pay | Admitting: Licensed Clinical Social Worker

## 2012-07-17 ENCOUNTER — Encounter (HOSPITAL_COMMUNITY): Payer: Self-pay | Admitting: Licensed Clinical Social Worker

## 2012-07-17 ENCOUNTER — Encounter (HOSPITAL_COMMUNITY): Payer: Self-pay

## 2012-07-17 NOTE — Progress Notes (Signed)
Patient ID: Mark Maxwell, male   DOB: 1956/01/25, 57 y.o.   MRN: [id number] Patient cancelled late.

## 2012-07-17 NOTE — Telephone Encounter (Signed)
Spoke to patient on February 3.  He stopped taking amitriptyline which he believe causing edema with MOBIC.  He was addressed this issue to his primary care physician.

## 2012-07-18 ENCOUNTER — Ambulatory Visit (INDEPENDENT_AMBULATORY_CARE_PROVIDER_SITE_OTHER): Payer: BC Managed Care – PPO | Admitting: Licensed Clinical Social Worker

## 2012-07-18 ENCOUNTER — Encounter (HOSPITAL_COMMUNITY): Payer: Self-pay | Admitting: Licensed Clinical Social Worker

## 2012-07-18 DIAGNOSIS — F329 Major depressive disorder, single episode, unspecified: Secondary | ICD-10-CM

## 2012-07-18 NOTE — Progress Notes (Signed)
   THERAPIST PROGRESS NOTE  Session Time: 8:30am-9:20am  Participation Level: Active  Behavioral Response: Well GroomedAlertAnxious and Depressed  Type of Therapy: Individual Therapy  Treatment Goals addressed: Coping  Interventions: CBT, Solution Focused, Strength-based, Supportive and Reframing  Summary: Mark Maxwell is a 57 y.o. male who presents with depressed mood and flat affect. He is upset after seeing his pain management doctor yesterday and being told that he wants patient off of Soma and hydrocodone. [name] angry and feels like he "has been kicked to the curb". He feels abandoned by his doctors and is discouraged. He questions if his pain will ever improve. He remains frustrated with his job and his partner. He has not given him his appraisal yet and has found it difficult to address the problems directly with his partner. He wants to step down, but has not heard anything from his boss and feels that it has been put on the back burner. His sleep is disrupted by pain and his appetite is wnl. He continues to endorse passive suicidal ideation, without intent or a plan. He is able to clearly state that his family and his faith are protective factors.    Suicidal/Homicidal: Nowithout intent/plan  Therapist Response: Assessed patients current functioning and reviewed progress. Reviewed coping strategies. Assessed patients safety and assisted in identifying protective factors.  Reviewed crisis plan with patient. Assisted patient with the expression of his feelings of frustration. Used CBT to assist patient with the identification of negative distortions and irrational thoughts. Encouraged patient to verbalize alternative and factual responses which challenge thought distortions. Reviewed healthy boundaries and assertive communication to use at work. Used motivational interviewing to assist and encourage patient through the change process. Explored patients barriers to change. Reviewed  patients self care plan. Assessed  progress related to self care. Patient's self care is good. Recommend proper diet, regular exercise, socialization and recreation. Recommend patient seek a second opinion at [facility] for pain and neck problems.   Plan: Return again in one weeks.  Diagnosis: Axis I: Depressive Disorder NOS    Axis II: No diagnosis    Mark Widmer, LCSW 07/18/2012

## 2012-07-25 ENCOUNTER — Other Ambulatory Visit (HOSPITAL_COMMUNITY): Payer: Self-pay | Admitting: Psychiatry

## 2012-07-30 ENCOUNTER — Ambulatory Visit (HOSPITAL_COMMUNITY): Payer: Self-pay | Admitting: Licensed Clinical Social Worker

## 2012-08-02 ENCOUNTER — Ambulatory Visit (HOSPITAL_COMMUNITY): Payer: Self-pay | Admitting: Psychiatry

## 2012-08-03 ENCOUNTER — Ambulatory Visit (INDEPENDENT_AMBULATORY_CARE_PROVIDER_SITE_OTHER): Payer: BC Managed Care – PPO | Admitting: Psychiatry

## 2012-08-03 ENCOUNTER — Encounter (HOSPITAL_COMMUNITY): Payer: Self-pay | Admitting: Psychiatry

## 2012-08-03 VITALS — BP 150/80 | HR 76 | Wt 206.0 lb
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
## 2012-08-07 ENCOUNTER — Ambulatory Visit (INDEPENDENT_AMBULATORY_CARE_PROVIDER_SITE_OTHER): Payer: BC Managed Care – PPO | Admitting: Licensed Clinical Social Worker

## 2012-08-07 DIAGNOSIS — F329 Major depressive disorder, single episode, unspecified: Secondary | ICD-10-CM

## 2012-08-07 NOTE — Progress Notes (Signed)
   THERAPIST PROGRESS NOTE  Session Time: 9:30am-10:20am  Participation Level: Active  Behavioral Response: Well GroomedAlertDepressed  Type of Therapy: Individual Therapy  Treatment Goals addressed: Coping  Interventions: CBT, Motivational Interviewing, Solution Focused, Strength-based, Supportive and Reframing  Summary: Mark Maxwell is a 57 y.o. male who presents with depressed mood and flat affect. He is clearly in pain today and reports that he will have to cut down on his pain medication before seeing his new doctor and expresses concern about his ability to function with increased pain. He remains overwhelmed and frustrated with his job. He has been unable to step down as he would like and does not feel hopeful that [employer] will provide this change for him. He worked 93 hours last week. He has been unable to set boundaries at work and resists the idea of saying no and changing his behavior. He likes being the guy everyone goes to for help, but he is slowly learning how this compromises his ability to do his job. He believes that [employer] is trying to get him to leave by increasing his workload to unreasonable demands. He endorses continued passive suicidal ideation, but is able to redirect his thoughts and is able to talk about his family as his protective factor. His sleep and appetite are wnl.    Suicidal/Homicidal: Yeswithout intent/plan  Therapist Response: Assessed patients current functioning and reviewed progress. Reviewed coping strategies. Assessed patients safety and assisted in identifying protective factors.  Reviewed crisis plan with patient. Assisted patient with the expression of his feelings of depression and frustraiton. Used CBT to assist patient with the identification of negative distortions and irrational thoughts. Encouraged patient to verbalize alternative and factual responses which challenge thought distortions. Explored patients resistance to boundary setting and the  negative consequences he experiences as a result of this. His passive suicidality is chronic in nature and it is unlikely that he will act upon these thoughts, given his ability to contract for safety, the duration of the thoughts and his protective factors. Used motivational interviewing to assist and encourage patient through the change process. Explored patients barriers to change. Reviewed patients self care plan. Assessed  progress related to self care. Patient's self care is good. Recommend proper diet, regular exercise, socialization and recreation.   Plan: Return again in one weeks.  Diagnosis: Axis I: Depressive Disorder NOS    Axis II: No diagnosis    Mark Augusta, LCSW 08/07/2012

## 2012-08-21 ENCOUNTER — Other Ambulatory Visit (HOSPITAL_COMMUNITY): Payer: Self-pay | Admitting: Psychiatry

## 2012-08-21 MED ORDER — AMITRIPTYLINE HCL 25 MG PO TABS: ORAL_TABLET | ORAL | Status: DC

## 2012-09-11 ENCOUNTER — Other Ambulatory Visit (HOSPITAL_COMMUNITY): Payer: Self-pay | Admitting: Psychiatry

## 2012-09-11 NOTE — Telephone Encounter (Signed)
New prescription given on 08/21/12. Too soon to refill

## 2012-09-13 ENCOUNTER — Encounter (HOSPITAL_COMMUNITY): Payer: Self-pay | Admitting: Psychiatry

## 2012-09-13 ENCOUNTER — Ambulatory Visit (INDEPENDENT_AMBULATORY_CARE_PROVIDER_SITE_OTHER): Payer: BC Managed Care – PPO | Admitting: Psychiatry

## 2012-09-13 VITALS — BP 127/72 | HR 100 | Wt 201.4 lb

## 2012-09-13 DIAGNOSIS — F0630 Mood disorder due to known physiological condition, unspecified: Secondary | ICD-10-CM

## 2012-09-13 MED ORDER — AMITRIPTYLINE HCL 25 MG PO TABS: ORAL_TABLET | ORAL | Status: DC
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
## 2012-09-14 ENCOUNTER — Ambulatory Visit (INDEPENDENT_AMBULATORY_CARE_PROVIDER_SITE_OTHER): Payer: BC Managed Care – PPO | Admitting: Licensed Clinical Social Worker

## 2012-09-14 DIAGNOSIS — F329 Major depressive disorder, single episode, unspecified: Secondary | ICD-10-CM

## 2012-09-14 NOTE — Progress Notes (Signed)
   THERAPIST PROGRESS NOTE  Session Time: 9:30am-10:20am  Participation Level: Active  Behavioral Response: Well GroomedAlertAnxious and Depressed  Type of Therapy: Individual Therapy  Treatment Goals addressed: Coping  Interventions: CBT, Motivational Interviewing, Strength-based, Assertiveness Training, Supportive and Reframing  Summary: Mark Maxwell is a 57 y.o. male who presents with depressed mood and flat affect. He reports changes made to his pain medication and is pleased that his new neurologist is willing to work with him and does acknowledge that patient needs to continue pain medication. He continues to experience high levels of stress related to his job. He has not followed through on setting boundaries at work and admits that he is fearful that others will not like him. 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 He states that his wife does get frustrated with him and his avoidance of conflict at work. [name] not sleeping well and it is taking him a long time to fall asleep. His appetite is wnl.   Suicidal/Homicidal: Nowithout intent/plan  Therapist Response: Assessed patients current functioning and reviewed progress. Reviewed coping strategies. Assessed patients safety and assisted in identifying protective factors.  Reviewed crisis plan with patient. Assisted patient with the expression of frustration. Reviewed patients self care plan. Assessed progress related to self care. Patients self care is good. Recommend daily exercise, increased socialization and recreation. Reviewed healthy boundaries and assertive communication. Used CBT to assist patient with the identification of negative distortions and irrational thoughts. Encouraged patient to verbalize alternative and factual responses which challenge thought distortions. Used motivational interviewing to assist and encourage patient through the change process. Explored  patients barriers to change. [name] challenged by boundary setting at work. He does not easily identify his stress as directly related to his lack of boundaries. Assisted him with insight into this matter.   Plan: Return again in one weeks.  Diagnosis: Axis I: Depressive Disorder NOS    Axis II: No diagnosis    Nyla Creason, LCSW 09/14/2012

## 2012-10-02 ENCOUNTER — Ambulatory Visit (HOSPITAL_COMMUNITY): Payer: Self-pay | Admitting: Licensed Clinical Social Worker

## 2012-10-05 ENCOUNTER — Ambulatory Visit (HOSPITAL_COMMUNITY): Payer: Self-pay | Admitting: Licensed Clinical Social Worker

## 2012-10-08 ENCOUNTER — Ambulatory Visit (INDEPENDENT_AMBULATORY_CARE_PROVIDER_SITE_OTHER): Payer: BC Managed Care – PPO | Admitting: Licensed Clinical Social Worker

## 2012-10-08 DIAGNOSIS — F329 Major depressive disorder, single episode, unspecified: Secondary | ICD-10-CM

## 2012-10-08 NOTE — Progress Notes (Signed)
   THERAPIST PROGRESS NOTE  Session Time: 1:00pm-1:50pm  Participation Level: Active  Behavioral Response: Well GroomedAlertAnxious and Depressed  Type of Therapy: Individual Therapy  Treatment Goals addressed: Coping  Interventions: CBT, Strength-based, Assertiveness Training, Supportive and Reframing  Summary: Mark Maxwell is a 57 y.o. male who presents with depressed mood and flat affect. He is pleased to report that he likes his new neurologist and is waiting for results of his MRI to further determine the source of his pain. He processes his anxiety related to taking pain medication and how he wishes he did not need it. He has tried to delegate some duties as discussed during his last session and has found this difficult, but did follow through on one minimal duty. He wants to find a new job and explores his ideas about this. He has not confronted his counterpart at work, but has tried to leave on time so he does not enable him by doing his work for him. His sleep and appetite are wnl. He continues to experience passive suicidal ideation and reports that the pastor at his church recently committed suicide. He found this experience enlightening regarding how his death has affected others. He feels he does not have the right to make the decision to end his life and that is only for God to decide. He is able to express his protective factors which are his family and his faith and commits to safely.    Suicidal/Homicidal: Yeswithout intent/plan  Therapist Response: Assessed patients current functioning and reviewed progress. Reviewed coping strategies. Assessed patients safety and assisted in identifying protective factors.  Reviewed crisis plan with patient. Assisted patient with the expression of frustration with his job. Reviewed patients self care plan. Assessed progress related to self care. Patients self care is fair. Recommend daily exercise, increased socialization and recreation.  Reviewed healthy boundaries and assertive communication. Used CBT to assist patient with the identification of negative distortions and irrational thoughts. Encouraged patient to verbalize alternative and factual responses which challenge thought distortions. Used motivational interviewing to assist and encourage patient through the change process. Explored patients barriers to change.   Plan: Return again in two weeks.  Diagnosis: Axis I: Depressive Disorder NOS    Axis II: No diagnosis    Tauren Delbuono, LCSW 10/08/2012

## 2012-10-15 ENCOUNTER — Other Ambulatory Visit (HOSPITAL_COMMUNITY): Payer: Self-pay | Admitting: Psychiatry

## 2012-10-15 DIAGNOSIS — F0630 Mood disorder due to known physiological condition, unspecified: Secondary | ICD-10-CM

## 2012-10-22 ENCOUNTER — Ambulatory Visit (INDEPENDENT_AMBULATORY_CARE_PROVIDER_SITE_OTHER): Payer: BC Managed Care – PPO | Admitting: Licensed Clinical Social Worker

## 2012-10-22 DIAGNOSIS — F329 Major depressive disorder, single episode, unspecified: Secondary | ICD-10-CM

## 2012-10-22 NOTE — Progress Notes (Signed)
   THERAPIST PROGRESS NOTE  Session Time: 9:30am-10:20am  Participation Level: Active  Behavioral Response: Well GroomedLethargicDepressed  Type of Therapy: Individual Therapy  Treatment Goals addressed: Coping  Interventions: CBT, Motivational Interviewing, Strength-based, Assertiveness Training, Supportive and Reframing  Summary: Mark Maxwell is a 57 y.o. male who presents with depressed mood and flat affect. He reports that his pain level is high this morning and he is struggling with stiffness. He processes his disappointment over learning that he has Parkinsonism and explores his fear over this diagnosis. Work continues to be his primary stressor. He has made small progress related to setting boundaries and delegating. He continues to struggle with his own conflict avoidance as the primary reason why he shy's away from holding others accountable. He endorses apprehension and anxiety when he thinks about setting boundaries and is fearful that if he does, his employees will quit. He continues to experience passive SI, but does not have a plan or intent. Since the pastor of his church committed suicide, he endorses a strong change of heart and has witnessed and felt himself the effects of suicide on those left behind. His sleep and appetite are wnl.  Suicidal/Homicidal: Yeswithout intent/plan  Therapist Response: Assessed patients current functioning and reviewed progress. Reviewed coping strategies. Assessed patients safety and assisted in identifying protective factors.  Reviewed crisis plan with patient. Assisted patient with the expression of frustration and depression . Reviewed patients self care plan. Assessed progress related to self care. Patients self care is fair. Recommend daily exercise, increased socialization and recreation. Used CBT to assist patient with the identification of negative distortions and irrational thoughts. Encouraged patient to verbalize alternative and factual  responses which challenge thought distortions. Reviewed healthy boundaries and assertive communication. Used motivational interviewing to assist and encourage patient through the change process. Explored patients barriers to change.   Plan: Return again in one weeks.  Diagnosis: Axis I: Depressive Disorder NOS    Axis II: No diagnosis    Raymonde Hamblin, LCSW 10/22/2012

## 2012-10-25 ENCOUNTER — Ambulatory Visit (INDEPENDENT_AMBULATORY_CARE_PROVIDER_SITE_OTHER): Payer: BC Managed Care – PPO | Admitting: Psychiatry

## 2012-10-25 ENCOUNTER — Encounter (HOSPITAL_COMMUNITY): Payer: Self-pay | Admitting: Psychiatry

## 2012-10-25 VITALS — BP 133/79 | HR 84 | Wt 204.0 lb

## 2012-10-25 DIAGNOSIS — F0630 Mood disorder due to known physiological condition, unspecified: Secondary | ICD-10-CM

## 2012-10-25 MED ORDER — AMITRIPTYLINE HCL 25 MG PO TABS: ORAL_TABLET | ORAL | Status: DC
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
## 2012-12-20 ENCOUNTER — Ambulatory Visit (INDEPENDENT_AMBULATORY_CARE_PROVIDER_SITE_OTHER): Payer: BC Managed Care – PPO | Admitting: Licensed Clinical Social Worker

## 2012-12-20 DIAGNOSIS — F329 Major depressive disorder, single episode, unspecified: Secondary | ICD-10-CM

## 2012-12-20 DIAGNOSIS — F3289 Other specified depressive episodes: Secondary | ICD-10-CM

## 2012-12-20 NOTE — Progress Notes (Signed)
   THERAPIST PROGRESS NOTE  Session Time: 8:30am-9:20am  Participation Level: Active  Behavioral Response: Well GroomedLethargicDepressed  Type of Therapy: Individual Therapy  Treatment Goals addressed: Coping  Interventions: CBT, Motivational Interviewing, Strength-based, Assertiveness Training, Supportive and Reframing  Summary: Mark Maxwell is a 56 y.o. male who presents with depressed mood and flat affect. He reports ongoing depression, chronic pain and work related stress. He has been diagnosed with Parkinson's disease and processes his feelings over this. He explores his prognosis and his fears. He continues to struggle setting appropriate boundaries at work. His self care at work is poor. He works many hours overtime, staying sometimes up to 6 hours after he is scheduled to leave. He does not eat or drink much of anything while he is working. He reports some improvement delegating tasks at work, but he realizes he needs to do more of this. He remains very conflict avoidant. He endorses stress related to his older son starting college and the cost of paying for this. His sleep and appetite are wnl. He endorses ongoing passive suicidal ideation, which is fleeting and which he is able to redirect. He is clear about not hurting himself after personally witnessing the effects suicide has on others. His family and his faith are his protective factors.   Suicidal/Homicidal: Nowithout intent/plan  Therapist Response: Assessed patients current functioning and reviewed progress. Reviewed coping strategies. Assessed patients safety and assisted in identifying protective factors.  Reviewed crisis plan with patient. Assisted patient with the expression of frustration and sadness. Reviewed patients self care plan. Assessed progress related to self care. Patients self care is fair. Recommend daily exercise, increased socialization and recreation. Reviewed healthy boundaries and assertive communication.  Used CBT to assist patient with the identification of negative distortions and irrational thoughts. Encouraged patient to verbalize alternative and factual responses which challenge thought distortions. Used motivational interviewing to assist and encourage patient through the change process. Explored patients barriers to change.   Plan: Return again in two weeks.  Diagnosis: Axis I: Depressive Disorder NOS    Axis II: No diagnosis    Nicholaos Schippers, LCSW 12/20/2012

## 2012-12-25 ENCOUNTER — Ambulatory Visit (HOSPITAL_COMMUNITY): Payer: Self-pay | Admitting: Psychiatry

## 2013-01-03 ENCOUNTER — Ambulatory Visit (INDEPENDENT_AMBULATORY_CARE_PROVIDER_SITE_OTHER): Payer: BC Managed Care – PPO | Admitting: Licensed Clinical Social Worker

## 2013-01-03 DIAGNOSIS — F329 Major depressive disorder, single episode, unspecified: Secondary | ICD-10-CM

## 2013-01-03 NOTE — Progress Notes (Signed)
   THERAPIST PROGRESS NOTE  Session Time: 8:30am-9:20am  Participation Level: Active  Behavioral Response: Well GroomedLethargicDepressed and Hopeless  Type of Therapy: Individual Therapy  Treatment Goals addressed: Coping  Interventions: CBT, Motivational Interviewing, Solution Focused, Strength-based, Assertiveness Training, Supportive and Reframing  Summary: Mark Maxwell is a 57 y.o. male who presents with depressed mood and flat affect. He is in significant pain today and processes his ongoing frustration with his pain and his diagnosis of Parkisnon's disease. He continues to experience frustration at work, works longer than he is required and has difficulty setting appropriate limits with his co-workers. He will sometimes work up to six hours over his shift to help others and "clean up messes". His wife is upset over this, but patient reports that this has been a long standing and hard to break habit of his. He endorses ongoing passive suicidal ideation, which is fleeting and intrusive, but which he is able to redirect his thoughts. He is able to clearly commit to safety. His sleep and appetite are wnl.   Suicidal/Homicidal: Nowithout intent/plan  Therapist Response: Assessed patients current functioning and reviewed progress. Reviewed coping strategies. Assessed patients safety and assisted in identifying protective factors.  Reviewed crisis plan with patient. Assisted patient with the expression of frustration. Reviewed patients self care plan. Assessed progress related to self care. Patients self care is fair. Recommend daily exercise, increased socialization and recreation. Reviewed healthy boundaries and assertive communication. Used CBT to assist patient with the identification of negative distortions and irrational thoughts. Encouraged patient to verbalize alternative and factual responses which challenge thought distortions. Used motivational interviewing to assist and encourage  patient through the change process. Explored patients barriers to change.   Plan: Return again in two weeks.  Diagnosis: Axis I: Major Depression, Recurrent severe    Axis II: No diagnosis    Stefania Goulart, LCSW 01/03/2013

## 2013-01-05 ENCOUNTER — Other Ambulatory Visit (HOSPITAL_COMMUNITY): Payer: Self-pay | Admitting: Psychiatry

## 2013-01-16 ENCOUNTER — Ambulatory Visit (INDEPENDENT_AMBULATORY_CARE_PROVIDER_SITE_OTHER): Payer: BC Managed Care – PPO | Admitting: Psychiatry

## 2013-01-16 ENCOUNTER — Encounter (HOSPITAL_COMMUNITY): Payer: Self-pay | Admitting: Psychiatry

## 2013-01-16 VITALS — BP 131/76 | HR 81 | Ht 73.0 in | Wt 200.8 lb

## 2013-01-16 DIAGNOSIS — F0630 Mood disorder due to known physiological condition, unspecified: Secondary | ICD-10-CM

## 2013-01-16 MED ORDER — QUETIAPINE FUMARATE 25 MG PO TABS: ORAL_TABLET | ORAL | Status: AC
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
## 2013-03-04 ENCOUNTER — Other Ambulatory Visit (HOSPITAL_COMMUNITY): Payer: Self-pay | Admitting: Psychiatry

## 2013-03-25 ENCOUNTER — Other Ambulatory Visit (HOSPITAL_COMMUNITY): Payer: Self-pay | Admitting: Physician Assistant

## 2013-07-01 ENCOUNTER — Encounter (INDEPENDENT_AMBULATORY_CARE_PROVIDER_SITE_OTHER): Payer: Self-pay

## 2013-07-01 ENCOUNTER — Ambulatory Visit (INDEPENDENT_AMBULATORY_CARE_PROVIDER_SITE_OTHER): Payer: BC Managed Care – PPO | Admitting: Licensed Clinical Social Worker

## 2013-07-01 DIAGNOSIS — F3289 Other specified depressive episodes: Secondary | ICD-10-CM

## 2013-07-01 DIAGNOSIS — F32A Depression, unspecified: Secondary | ICD-10-CM

## 2013-07-01 DIAGNOSIS — F329 Major depressive disorder, single episode, unspecified: Secondary | ICD-10-CM

## 2013-07-01 NOTE — Progress Notes (Signed)
   THERAPIST PROGRESS NOTE  Session Time: 9:30am-10:20am  Participation Level: Active  Behavioral Response: Well GroomedLethargicDepressed  Type of Therapy: Individual Therapy  Treatment Goals addressed: Coping  Interventions: CBT, Motivational Interviewing, Strength-based, Assertiveness Training, Supportive and Reframing  Summary: Mark ApleyMichael Maxwell is a 58 y.o. male who presents with depressed mood and flat affect. Patient has not been to therapy since July and reports ongoing depression, with chronic passive suicidal thoughts. He denies any plan or intent, but reports "negotiating" with himself daily over suicide. He reports that his family, his faith and his work are his protective factors. He remains primarily frustrated with his job, the lack of support he is given by management and the conflict he finds difficult to manage. He processes his anger related his job and continues to demonstrate learned helplessness. He is angry over being audited due to lack of assistance by his co-worker. He remains avoidant of  conflict at work and at home. He is not engaged in any recreational activity which brings him joy or a release of stress.  His sleep is very poor. His appetite is wnl.   Suicidal/Homicidal: Yeswithout intent/plan  Therapist Response: Assessed patients current functioning and reviewed progress. Reviewed coping strategies. Assessed patients safety and assisted in identifying protective factors.  Reviewed crisis plan with patient. Assisted patient with the expression of frustration. Reviewed patients self care plan. Assessed progress related to self care. Patients self care is fair. Recommend daily exercise, increased socialization and recreation. Reviewed healthy boundaries and assertive communication. Used CBT to assist patient with the identification of negative distortions and irrational thoughts. Encouraged patient to verbalize alternative and factual responses which challenge thought  distortions. Discussed termination of care due to this writer leaving. Processed patients progress. Patient does not want to be transferred to someone else in this practice and plans to seek out treatment in [facility].   Plan: Patient plans to follow up with care in [facility], which is closer to his home.   Diagnosis: Axis I: Depressive Disorder NOS    Axis II: No diagnosis    Kenosha Doster, LCSW 07/01/2013

## 2018-03-10 ENCOUNTER — Encounter (HOSPITAL_BASED_OUTPATIENT_CLINIC_OR_DEPARTMENT_OTHER): Payer: Self-pay | Admitting: Emergency Medicine

## 2018-03-10 ENCOUNTER — Emergency Department (HOSPITAL_BASED_OUTPATIENT_CLINIC_OR_DEPARTMENT_OTHER)
Admission: EM | Admit: 2018-03-10 | Discharge: 2018-03-10 | Disposition: A | Discharge status: Home / Self Care | Payer: 59 | Attending: Emergency Medicine | Admitting: Emergency Medicine

## 2018-03-10 ENCOUNTER — Emergency Department (HOSPITAL_BASED_OUTPATIENT_CLINIC_OR_DEPARTMENT_OTHER): Payer: 59

## 2018-03-10 ENCOUNTER — Other Ambulatory Visit: Payer: Self-pay

## 2018-03-10 DIAGNOSIS — I10 Essential (primary) hypertension: Secondary | ICD-10-CM | POA: Diagnosis not present

## 2018-03-10 DIAGNOSIS — Y999 Unspecified external cause status: Secondary | ICD-10-CM | POA: Diagnosis not present

## 2018-03-10 DIAGNOSIS — Y9389 Activity, other specified: Secondary | ICD-10-CM | POA: Diagnosis not present

## 2018-03-10 DIAGNOSIS — M79662 Pain in left lower leg: Secondary | ICD-10-CM | POA: Diagnosis not present

## 2018-03-10 DIAGNOSIS — Y929 Unspecified place or not applicable: Secondary | ICD-10-CM | POA: Insufficient documentation

## 2018-03-10 DIAGNOSIS — M25552 Pain in left hip: Secondary | ICD-10-CM | POA: Diagnosis not present

## 2018-03-10 DIAGNOSIS — M545 Low back pain: Secondary | ICD-10-CM | POA: Insufficient documentation

## 2018-03-10 DIAGNOSIS — Z7982 Long term (current) use of aspirin: Secondary | ICD-10-CM | POA: Insufficient documentation

## 2018-03-10 DIAGNOSIS — W11XXXA Fall on and from ladder, initial encounter: Secondary | ICD-10-CM | POA: Diagnosis not present

## 2018-03-10 DIAGNOSIS — Z79899 Other long term (current) drug therapy: Secondary | ICD-10-CM | POA: Diagnosis not present

## 2018-03-10 DIAGNOSIS — M25512 Pain in left shoulder: Secondary | ICD-10-CM | POA: Diagnosis not present

## 2018-03-10 DIAGNOSIS — S99922A Unspecified injury of left foot, initial encounter: Secondary | ICD-10-CM | POA: Diagnosis present

## 2018-03-10 DIAGNOSIS — S92015A Nondisplaced fracture of body of left calcaneus, initial encounter for closed fracture: Secondary | ICD-10-CM

## 2018-03-10 MED ORDER — KETOROLAC TROMETHAMINE 30 MG/ML IJ SOLN
30.0000 mg | Freq: Once | INTRAMUSCULAR | Status: AC
  Administered 2018-03-10: 30 mg via INTRAVENOUS
  Filled 2018-03-10: qty 1

## 2018-03-10 MED ORDER — OXYCODONE-ACETAMINOPHEN 5-325 MG PO TABS: 1.0000 | ORAL_TABLET | ORAL | 0 refills | Status: AC | PRN

## 2018-03-10 NOTE — ED Notes (Signed)
ED Provider at bedside. 

## 2018-03-10 NOTE — ED Triage Notes (Signed)
Pt states he fell ~ 20 ft off of ladder onto concrete. C/o L heel pain, L hip and L shoulder. Denies hitting his head. Fall occurred ~ 45 min PTA.

## 2018-03-10 NOTE — ED Provider Notes (Signed)
[facility] EMERGENCY DEPARTMENT Provider Note   CSN: [id number] Arrival date & time: 03/10/18  1938     History   Chief Complaint Chief Complaint  Patient presents with  . Fall    HPI Mark Maxwell is a 62 y.o. male.  The history is provided by the patient, a relative and medical records. No language interpreter was used.  Fall  This is a new problem. The current episode started less than 1 hour ago. The problem occurs constantly. The problem has not changed since onset.Pertinent negatives include no chest pain, no abdominal pain, no headaches and no shortness of breath. The symptoms are aggravated by walking and standing. Nothing relieves the symptoms. He has tried nothing for the symptoms. The treatment provided no relief.    Past Medical History:  Diagnosis Date  . Anxiety   . BPH (benign prostatic hyperplasia)   . Chronic neck pain   . Depression   . GERD (gastroesophageal reflux disease)   . Hyperlipemia   . Hypertension     Patient Active Problem List   Diagnosis Date Noted  . Depression 05/02/2012    Past Surgical History:  Procedure Laterality Date  . CERVICAL DISCECTOMY    . CERVICAL FUSION    . NECK SURGERY    . ROTATOR CUFF REPAIR    . TONSILLECTOMY          Home Medications    Prior to Admission medications   Medication Sig Start Date End Date Taking? Authorizing Provider  acyclovir (ZOVIRAX) 400 MG tablet Take 400 mg by mouth 2 (two) times daily.    [provider]  amitriptyline (ELAVIL) 25 MG tablet TAKE 1 TO 2 TABLETS BY MOUTH AT BEDTIME 03/04/13   Arfeen, Phillips Grout, MD  Ascorbic Acid (VITAMIN C) 1000 MG tablet Take 1,000 mg by mouth daily.    [provider]  aspirin 325 MG EC tablet Take 325 mg by mouth daily.    [provider]  carbidopa-levodopa (SINEMET IR) 25-100 MG per tablet Take 1 tablet by mouth 3 (three) times daily.    Denese Killings., MD  carisoprodol (SOMA) 350 MG tablet Take 350 mg by mouth  3 (three) times daily as needed.    [provider]  diphenhydrAMINE (SOMINEX) 25 MG tablet Take 50 mg by mouth at bedtime as needed.    [provider]  escitalopram (LEXAPRO) 20 MG tablet Take 20 mg by mouth daily.    [provider]  fluticasone Aleda Grana) 50 MCG/ACT nasal spray  05/20/12   [provider]  FLUZONE PRESERVATIVE FREE injection  02/10/12   [provider]  furosemide (LASIX) 20 MG tablet  07/27/12   [provider]  gabapentin (NEURONTIN) 300 MG capsule Take 300 mg by mouth 4 (four) times daily.    [provider]  Glucosamine-Chondroit-Vit C-Mn (GLUCOSAMINE 1500 COMPLEX) CAPS Take 1 capsule by mouth daily.    [provider]  HYDROcodone-acetaminophen (NORCO) 10-325 MG per tablet Take 2 tablets by mouth every 6 (six) hours as needed.    [provider]  Melatonin 3 MG CAPS Take 3 capsules by mouth at bedtime as needed.    [provider]  Nutritional Supplements (DHEA PO) Take 25 mg by mouth daily.    [provider]  omeprazole (PRILOSEC) 20 MG capsule Take 20 mg by mouth 2 (two) times daily.    [provider]  QUEtiapine (SEROQUEL) 25 MG tablet Take 1-2 tab at bed  time 01/16/13   Arfeen, Phillips Grout, MD  S-Adenosylmethionine (SAM-E) 400 MG TABS Take 1 tablet by mouth daily.    [provider]  terazosin (HYTRIN) 5 MG capsule Take 5 mg by mouth 2 (two) times daily.    [provider]  [facility] 625 MG tablet  02/08/12   [provider]    Family History Family History  Problem Relation Age of Onset  . Anxiety disorder Mother   . Depression Mother   . Alcohol abuse Mother   . Alcohol abuse Father   . Alcohol abuse Sister   . Alcohol abuse Sister     Social History Social History   Tobacco Use  . Smoking status: Never Smoker  . Smokeless tobacco: Never Used  Substance Use Topics  . Alcohol use: No    Comment: quit drinking in Feb 2013 due to  taking pain medication   . Drug use: No     Allergies   Patient has no known allergies.   Review of Systems Review of Systems  Constitutional: Negative for chills, diaphoresis, fatigue and fever.  HENT: Negative for congestion.   Eyes: Negative for visual disturbance.  Respiratory: Negative for cough, chest tightness, shortness of breath and wheezing.   Cardiovascular: Negative for chest pain.  Gastrointestinal: Negative for abdominal pain, constipation, diarrhea, nausea and vomiting.  Genitourinary: Negative for flank pain.  Musculoskeletal: Positive for back pain. Negative for neck pain and neck stiffness.  Skin: Negative for rash and wound.  Neurological: Negative for weakness, light-headedness, numbness and headaches.  Psychiatric/Behavioral: Negative for agitation.  All other systems reviewed and are negative.    Physical Exam Updated Vital Signs BP (!) 138/93   Pulse (!) 114   Temp 98 F (36.7 C)   Resp 20   Ht 6\' 1"  (1.854 m)   Wt 90.7 kg   SpO2 97%   BMI 26.39 kg/m   Physical Exam  Constitutional: He is oriented to person, place, and time. He appears well-developed and well-nourished. No distress.  HENT:  Head: Normocephalic and atraumatic.  Nose: Nose normal.  Mouth/Throat: No oropharyngeal exudate.  Eyes: Pupils are equal, round, and reactive to light. Conjunctivae are normal.  Neck: Neck supple.  Cardiovascular: Normal rate and regular rhythm.  No murmur heard. Pulmonary/Chest: Effort normal and breath sounds normal. No respiratory distress. He has no wheezes. He has no rales. He exhibits no tenderness.  Abdominal: Soft. There is no tenderness.  Musculoskeletal: He exhibits tenderness. He exhibits no edema.       Left hip: He exhibits tenderness.       Left ankle: He exhibits swelling and ecchymosis. He exhibits no deformity, no laceration and normal pulse. Tenderness.       Lumbar back: He exhibits tenderness and pain.       Back:       Left lower  leg: He exhibits tenderness.       Legs:      Left foot: There is tenderness.  Normal sensation and strength in the foot.  Normal DP and PT pulse.  Bruising and swelling seen at the heel and ankle.  Tenderness in the left shin.  Tenderness in the left hip and left low back.  Normal knee exam.  Neurological: He is alert and oriented to person, place, and time. No cranial nerve deficit or sensory deficit. He exhibits normal muscle tone. Coordination normal.  Skin: Skin is warm and dry. Capillary refill takes less than 2 seconds. He is  not diaphoretic. No erythema. No pallor.  Psychiatric: He has a normal mood and affect.  Nursing note and vitals reviewed.    ED Treatments / Results  Labs (all labs ordered are listed, but only abnormal results are displayed) Labs Reviewed - No data to display  EKG None  Radiology Dg Tibia/fibula Left  Result Date: 03/10/2018 CLINICAL DATA:  Fall 20 feet from ladder EXAM: LEFT TIBIA AND FIBULA - 2 VIEW COMPARISON:  None. FINDINGS: No fracture or dislocation is seen. The joint spaces are preserved. The visualized soft tissues are unremarkable. No definite suprapatellar knee joint effusion. IMPRESSION: Negative. Electronically Signed   By: Charline Bills M.D.   On: 03/10/2018 21:40   Dg Ankle Complete Left  Result Date: 03/10/2018 CLINICAL DATA:  Fall 20 feet from ladder EXAM: LEFT ANKLE COMPLETE - 3+ VIEW COMPARISON:  None. FINDINGS: No fracture or dislocation is seen. The ankle mortise is intact. Mild lateral soft tissue swelling. IMPRESSION: No fracture or dislocation is seen. Mild lateral soft tissue swelling. Electronically Signed   By: Charline Bills M.D.   On: 03/10/2018 21:35   Ct Lumbar Spine Wo Contrast  Result Date: 03/10/2018 CLINICAL DATA:  Fall from ladder onto concrete. EXAM: CT LUMBAR SPINE WITHOUT CONTRAST TECHNIQUE: Multidetector CT imaging of the lumbar spine was performed without intravenous contrast administration. Multiplanar CT  image reconstructions were also generated. COMPARISON:  Lumbar spine CT 01/27/2012 FINDINGS: Segmentation: Normal Alignment: No static subluxation. Vertebrae: No acute fracture or focal pathologic process. Paraspinal and other soft tissues: Right nephrolithiasis without hydronephrosis. Disc levels: The T10-L1 levels are unremarkable. L1-L2: Disc vacuum phenomenon. Endplate spurring. No spinal canal or neural foraminal stenosis. L2-L3: Small disc bulge. No spinal canal or neural foraminal stenosis. L3-L4: Minimal disc bulge. No spinal canal or neural foraminal stenosis. L4-L5: Moderate right and mild left facet hypertrophy with moderate right neural foraminal stenosis. No spinal canal stenosis. L5-S1: Disc vacuum phenomenon with mild bilateral facet hypertrophy. No spinal canal stenosis. Unchanged bilateral moderate-to-severe neural foraminal stenosis. IMPRESSION: 1. No acute fracture or static subluxation of the cervical spine. 2. Unchanged bilateral L5-S1 moderate-to-severe neural foraminal stenosis. 3. Unchanged moderate right L4-5 neural foraminal stenosis. Electronically Signed   By: Deatra Robinson M.D.   On: 03/10/2018 21:47   Dg Shoulder Left  Result Date: 03/10/2018 CLINICAL DATA:  Fall 20 feet from ladder EXAM: LEFT SHOULDER - 2+ VIEW COMPARISON:  None. FINDINGS: No fracture or dislocation is seen. Mild degenerative changes of the glenohumeral joint. Visualized soft tissues are within normal limits. Visualized left lung is clear. IMPRESSION: Negative. Electronically Signed   By: Charline Bills M.D.   On: 03/10/2018 21:35   Dg Foot Complete Left  Result Date: 03/10/2018 CLINICAL DATA:  Fall 20 feet from ladder EXAM: LEFT FOOT - COMPLETE 3+ VIEW COMPARISON:  None. FINDINGS: Nondisplaced fracture through the midportion of the calcaneus. Mild degenerative changes of the 1st MTP joint. The visualized soft tissues are unremarkable. IMPRESSION: Nondisplaced calcaneal fracture. Electronically Signed   By:  Charline Bills M.D.   On: 03/10/2018 21:34   Dg Hip Unilat With Pelvis 2-3 Views Left  Result Date: 03/10/2018 CLINICAL DATA:  Fall 20 feet from ladder, left leg pain EXAM: DG HIP (WITH OR WITHOUT PELVIS) 2-3V LEFT COMPARISON:  None. FINDINGS: No fracture or dislocation is seen. Bilateral hip joint spaces are mildly narrowed but symmetric. Visualized bony pelvis appears intact. Degenerative changes the lower lumbar spine. IMPRESSION: Negative. Electronically Signed   By: Charline Bills  M.D.   On: 03/10/2018 21:33    Procedures Procedures (including critical care time)  Medications Ordered in ED Medications  ketorolac (TORADOL) 30 MG/ML injection 30 mg (30 mg Intravenous Given 03/10/18 2330)     Initial Impression / Assessment and Plan / ED Course  I have reviewed the triage vital signs and the nursing notes.  Pertinent labs & imaging results that were available during my care of the patient were reviewed by me and considered in my medical decision making (see chart for details).     Akbar Sacra is a 62 y.o. male with a past medical history significant for hypertension, hyperlipidemia, anxiety, depression, GERD, and prior cervical spine surgery with chronic pain who presents with a fall.  Patient reports that he was jumping on a tall cedar tree when his ladder began to fall.  He reports that he fell approximately 20 feet down landing on his heels onto concrete.  He reports that he had immediate onset of pain in his left heel, left ankle, left shin, left hip, and low back.  He reports he also has pain in his left shoulder but denies any chest pain, upper back pain, abdominal pain, or headache.  He denies any neck pain or neck stiffness.  No loss of consciousness.  No neurologic complaints.  He reports he was unable to bear weight on the left foot after the injury.  He is denying any pain in his right leg.  He reports his pain as moderate to severe, and 8 out of 10 in severity.  He  currently does not want any pain medicine as he reports he is on a pain contract.  On exam, patient has tenderness in his left heel, his left ankle, his left shin.  Patient has symmetric pulses in the DP and PT arteries.  Patient also has normal sensation in the feet.  Patient has tenderness in his left hip.  No tenderness in the left knee.  Patient has tenderness in his middle and left low back.  Lungs clear and chest was nontender.  Abdomen nontender.  No tenderness in the neck or head.  No evidence of trauma to the head.  Symmetric grip strength bilaterally normal sensation and pulses in the upper extremities.  Tenderness in the left shoulder.  Normal range of motion.    Patient will have x-rays of his left foot, ankle, and shin as well as the left hip/pelvis and left shoulder.  He will have a CT of the lumbar spine.  Patient does not want pain medicine at this time.  Anticipate reassessment after imaging.  Diagnostic imaging was only positive for a left nondisplaced calcaneus fracture.  CT imaging of the lumbar spine showed no fracture dislocation.  Imaging of the pelvis, left hip, left tib-fib, and left ankle were reassuring.  Degenerative disease was seen.  shoulder x-ray was reassuring.  Heart rate improved during ED stay.  No evidence of intrathoracic or intra-abdominal trauma on exam or complaint.   Patient requested a Toradol shot as this is compliant with his pain regimen.  This was ordered.  Due to the fracture, patient be placed in a fracture boot and will be nonweightbearing with crutches.  Patient has an orthopedist he will call for follow-up.  He was given a second number to call as well.  Patient given prescription for Percocet and will follow-up with the orthopedic team.  Patient understood return precautions for any new or worsened symptoms.  Given his otherwise well appearance, patient  was felt stable for discharge home.  Patient discharged in good condition.   Final Clinical  Impressions(s) / ED Diagnoses   Final diagnoses:  Closed nondisplaced fracture of body of left calcaneus, initial encounter  Fall from ladder, initial encounter    ED Discharge Orders         Ordered    oxyCODONE-acetaminophen (PERCOCET/ROXICET) 5-325 MG tablet  Every 4 hours PRN     03/10/18 2323          Clinical Impression: 1. Closed nondisplaced fracture of body of left calcaneus, initial encounter   2. Fall from ladder, initial encounter     Disposition: Discharge  Condition: Good  I have discussed the results, Dx and Tx plan with the pt(& family if present). He/she/they expressed understanding and agree(s) with the plan. Discharge instructions discussed at great length. Strict return precautions discussed and pt &/or family have verbalized understanding of the instructions. No further questions at time of discharge.    Discharge Medication List as of 03/10/2018 11:27 PM    START taking these medications   Details  oxyCODONE-acetaminophen (PERCOCET/ROXICET) 5-325 MG tablet Take 1 tablet by mouth every 4 (four) hours as needed for severe pain., Starting Sat 03/10/2018, Print        Follow Up: Lenda Kelp, MD [address] [place] [address] [phone]        Tegeler, Canary Brim, MD 03/11/18 [phone]

## 2018-03-10 NOTE — ED Notes (Signed)
Patient verbalizes understanding of discharge instructions. Opportunity for questioning and answers were provided. Armband removed by staff, pt discharged from ED to his home via POV.  

## 2018-03-10 NOTE — ED Notes (Signed)
PMS intact before and after. Pt tolerated well. All questions answered. 

## 2018-03-10 NOTE — Discharge Instructions (Addendum)
Your work-up today showed evidence of a calcaneus fracture that is nondisplaced.  We did not see any other fractures dislocations or other traumatic injuries from your fall.  Please wear the boot to help protect your heel and ankle and do not bear any weight until you see the orthopedics team.  Please call your orthopedist.  I included the contact information for another orthopedist if she would like to see someone else.  Please speak with your pain management team for further pain management and follow-up with the orthopedist recommendations.  If any symptoms change or worsen, please return to the nearest emergency department.

## 2018-03-10 NOTE — ED Notes (Signed)
rn w/ patient @2010 

## 2021-04-02 ENCOUNTER — Ambulatory Visit: Payer: 59 | Attending: Internal Medicine

## 2021-04-02 ENCOUNTER — Other Ambulatory Visit (HOSPITAL_BASED_OUTPATIENT_CLINIC_OR_DEPARTMENT_OTHER): Payer: Self-pay

## 2021-04-02 DIAGNOSIS — Z23 Encounter for immunization: Secondary | ICD-10-CM

## 2021-04-02 MED ORDER — INFLUENZA VAC A&B SA ADJ QUAD 0.5 ML IM PRSY
PREFILLED_SYRINGE | INTRAMUSCULAR | 0 refills | Status: AC
  Filled 2021-04-02: qty 0.5, 1d supply, fill #0

## 2021-04-02 NOTE — Progress Notes (Signed)
   Covid-19 Vaccination Clinic  Name:  Hubert Raatz    MRN: [id number] DOB: [birth year]  04/02/2021  Mr. Zapien was observed post Covid-19 immunization for 15 minutes without incident. He was provided with Vaccine Information Sheet and instruction to access the V-Safe system.   Mr. Fill was instructed to call 911 with any severe reactions post vaccine: Difficulty breathing  Swelling of face and throat  A fast heartbeat  A bad rash all over body  Dizziness and weakness   Immunizations Administered     Name Date Dose VIS Date Route   Pfizer Covid-19 Vaccine Bivalent Booster 04/02/2021  2:31 PM 0.3 mL 02/10/2021 Intramuscular   Manufacturer: [employer], [employer]   Lot: [id number]   NDC: [phone]

## 2021-04-30 ENCOUNTER — Other Ambulatory Visit (HOSPITAL_BASED_OUTPATIENT_CLINIC_OR_DEPARTMENT_OTHER): Payer: Self-pay

## 2021-04-30 MED ORDER — PFIZER COVID-19 VAC BIVALENT 30 MCG/0.3ML IM SUSP
INTRAMUSCULAR | 0 refills | Status: AC
  Filled 2021-04-30: qty 0.3, 1d supply, fill #0
